# Patient Record
Sex: Female | Born: 1965 | Race: White | Hispanic: No | Marital: Married | Smoking: Former smoker
Health system: Southern US, Community
[De-identification: ages and names within clinical notes are randomized; demographics above are authoritative.]

## PROBLEM LIST (undated history)

## (undated) DIAGNOSIS — J45909 Unspecified asthma, uncomplicated: Secondary | ICD-10-CM

## (undated) DIAGNOSIS — K219 Gastro-esophageal reflux disease without esophagitis: Secondary | ICD-10-CM

## (undated) DIAGNOSIS — I10 Essential (primary) hypertension: Secondary | ICD-10-CM

## (undated) DIAGNOSIS — C801 Malignant (primary) neoplasm, unspecified: Secondary | ICD-10-CM

## (undated) HISTORY — DX: Gastro-esophageal reflux disease without esophagitis: K21.9

## (undated) HISTORY — PX: POLYPECTOMY: SHX149

## (undated) HISTORY — DX: Essential (primary) hypertension: I10

---
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
## 2001-01-04 HISTORY — PX: ADENOIDECTOMY: SHX5191

## 2008-01-06 HISTORY — PX: BASAL CELL CARCINOMA EXCISION: SHX1214

## 2008-06-01 ENCOUNTER — Ambulatory Visit: Payer: Self-pay | Admitting: Gastroenterology

## 2008-09-27 LAB — HM PAP SMEAR: HM Pap smear: NORMAL

## 2009-08-09 ENCOUNTER — Emergency Department: Payer: Self-pay | Admitting: Internal Medicine

## 2009-08-23 ENCOUNTER — Ambulatory Visit: Payer: Self-pay | Admitting: Gastroenterology

## 2011-05-20 ENCOUNTER — Ambulatory Visit: Payer: Self-pay | Admitting: Family Medicine

## 2014-09-11 LAB — BASIC METABOLIC PANEL
BUN: 14 mg/dL (ref 4–21)
CREATININE: 0.7 mg/dL (ref 0.5–1.1)
Glucose: 98 mg/dL
Potassium: 4.7 mmol/L (ref 3.4–5.3)
SODIUM: 140 mmol/L (ref 137–147)

## 2014-09-11 LAB — LIPID PANEL
CHOLESTEROL: 143 mg/dL (ref 0–200)
HDL: 40 mg/dL (ref 35–70)
LDL Cholesterol: 74 mg/dL
Triglycerides: 146 mg/dL (ref 40–160)

## 2014-09-11 LAB — CBC AND DIFFERENTIAL
HEMATOCRIT: 35 % — AB (ref 36–46)
HEMOGLOBIN: 11.8 g/dL — AB (ref 12.0–16.0)
Platelets: 289 10*3/uL (ref 150–399)
WBC: 8 10^3/mL

## 2014-11-07 ENCOUNTER — Ambulatory Visit: Payer: Self-pay | Admitting: Family Medicine

## 2015-03-08 DIAGNOSIS — L509 Urticaria, unspecified: Secondary | ICD-10-CM | POA: Insufficient documentation

## 2015-03-08 DIAGNOSIS — C4491 Basal cell carcinoma of skin, unspecified: Secondary | ICD-10-CM | POA: Insufficient documentation

## 2015-03-08 DIAGNOSIS — K219 Gastro-esophageal reflux disease without esophagitis: Secondary | ICD-10-CM | POA: Insufficient documentation

## 2015-03-08 DIAGNOSIS — L700 Acne vulgaris: Secondary | ICD-10-CM | POA: Insufficient documentation

## 2015-03-08 DIAGNOSIS — IMO0002 Reserved for concepts with insufficient information to code with codable children: Secondary | ICD-10-CM | POA: Insufficient documentation

## 2015-03-08 DIAGNOSIS — K2950 Unspecified chronic gastritis without bleeding: Secondary | ICD-10-CM | POA: Insufficient documentation

## 2015-03-08 DIAGNOSIS — IMO0001 Reserved for inherently not codable concepts without codable children: Secondary | ICD-10-CM | POA: Insufficient documentation

## 2015-03-08 DIAGNOSIS — E282 Polycystic ovarian syndrome: Secondary | ICD-10-CM | POA: Insufficient documentation

## 2015-03-08 DIAGNOSIS — M546 Pain in thoracic spine: Secondary | ICD-10-CM | POA: Insufficient documentation

## 2015-03-08 DIAGNOSIS — R7303 Prediabetes: Secondary | ICD-10-CM | POA: Insufficient documentation

## 2015-03-08 DIAGNOSIS — R51 Headache: Secondary | ICD-10-CM

## 2015-03-08 DIAGNOSIS — R030 Elevated blood-pressure reading, without diagnosis of hypertension: Secondary | ICD-10-CM

## 2015-03-08 DIAGNOSIS — R519 Headache, unspecified: Secondary | ICD-10-CM | POA: Insufficient documentation

## 2015-03-09 ENCOUNTER — Other Ambulatory Visit: Payer: Self-pay

## 2015-03-09 ENCOUNTER — Ambulatory Visit (INDEPENDENT_AMBULATORY_CARE_PROVIDER_SITE_OTHER): Payer: BLUE CROSS/BLUE SHIELD | Admitting: Physician Assistant

## 2015-03-09 ENCOUNTER — Ambulatory Visit
Admission: RE | Admit: 2015-03-09 | Discharge: 2015-03-09 | Disposition: A | Discharge status: Home / Self Care | Payer: BLUE CROSS/BLUE SHIELD | Source: Ambulatory Visit | Attending: Physician Assistant | Admitting: Physician Assistant

## 2015-03-09 ENCOUNTER — Encounter: Payer: Self-pay | Admitting: Physician Assistant

## 2015-03-09 VITALS — BP 126/70 | HR 64 | Resp 14 | Wt 241.6 lb

## 2015-03-09 DIAGNOSIS — R042 Hemoptysis: Secondary | ICD-10-CM | POA: Diagnosis present

## 2015-03-09 NOTE — Progress Notes (Signed)
   Subjective:    Patient ID: Heather Bauer, female    DOB: Mar 05, 1966, 49 y.o.   MRN: [id number]  Cough This is a recurrent problem. The current episode started 1 to 4 weeks ago. The problem has been gradually improving. The problem occurs every few minutes. The cough is productive of blood-tinged sputum. Associated symptoms include heartburn, hemoptysis, nasal congestion, postnasal drip, a sore throat and shortness of breath. Pertinent negatives include no chest pain, chills, ear congestion, ear pain, fever, headaches, myalgias, rash, rhinorrhea, sweats, weight loss or wheezing. The symptoms are aggravated by lying down. Risk factors: possible mold exposure in home. She has tried a beta-agonist inhaler, body position changes, OTC cough suppressant, prescription cough suppressant, steroid inhaler and rest for the symptoms. The treatment provided mild relief. Her past medical history is significant for bronchitis and environmental allergies. There is no history of asthma, bronchiectasis, COPD, emphysema or pneumonia.      Review of Systems  Constitutional: Negative for fever, chills and weight loss.  HENT: Positive for postnasal drip and sore throat. Negative for ear pain and rhinorrhea.   Respiratory: Positive for cough, hemoptysis and shortness of breath. Negative for wheezing.   Cardiovascular: Negative for chest pain.  Gastrointestinal: Positive for heartburn and vomiting.  Musculoskeletal: Negative for myalgias.  Skin: Negative for rash.  Allergic/Immunologic: Positive for environmental allergies.  Neurological: Negative for headaches.       Objective:   Physical Exam  Constitutional: She appears well-developed and well-nourished. No distress.  HENT:  Head: Normocephalic and atraumatic.  Right Ear: Tympanic membrane, external ear and ear canal normal.  Left Ear: Tympanic membrane, external ear and ear canal normal.  Nose: Nose normal.  Mouth/Throat: Oropharynx is clear and moist.  No oropharyngeal exudate.  Neck: Normal range of motion. Neck supple.  Cardiovascular: Normal rate, regular rhythm and normal heart sounds.  Exam reveals no gallop and no friction rub.   No murmur heard. Pulmonary/Chest: Effort normal and breath sounds normal. No respiratory distress. She has no wheezes. She has no rales. She exhibits no tenderness.  Lymphadenopathy:    She has no cervical adenopathy.  Skin: Skin is warm and dry.  Vitals reviewed.         Assessment & Plan:  1. Cough with hemoptysis Being that the blood tinged sputum is new and with smoking history will get CXR and refer to pulmonology for further evaluation.  If pulmonology exam is normal but still having blood-tinged sputum will consider referral to GI for further evaluation with EGD due to GERD.  She has had increased burning sensation of the throat since the bronchitis began and I advised her to begin her dexilant again, which she has and takes daily now.  She will call if symptoms worsen or fail to improve.  - DG Chest 2 View; Future - Ambulatory referral to Pulmonology

## 2015-03-09 NOTE — Patient Instructions (Signed)
Hemoptysis Hemoptysis, which means coughing up blood, can be a sign of a minor problem or a serious medical condition. The blood that is coughed up may come from the lungs and airways. Coughed-up blood can also come from bleeding that occurs outside the lungs and airways. Blood can drain into the windpipe during a severe nosebleed or when blood is vomited from the stomach. Because hemoptysis can be a sign of something serious, a medical evaluation is required. For some people with hemoptysis, no definite cause is ever identified. CAUSES  The most common cause of hemoptysis is bronchitis. Some other common causes include:   A ruptured blood vessel caused by coughing or an infection.   A medical condition that causes damage to the large air passageways (bronchiectasis).   A blood clot in the lungs (pulmonary embolism).   Pneumonia.   Tuberculosis.   Breathing in a small foreign object.   Cancer. For some people with hemoptysis, no definite cause is ever identified.  HOME CARE INSTRUCTIONS  Only take over-the-counter or prescription medicines as directed by your caregiver. Do not use cough suppressants unless your caregiver approves.  If your caregiver prescribes antibiotic medicines, take them as directed. Finish them even if you start to feel better.  Do not smoke. Also avoid secondhand smoke.  Follow up with your caregiver as directed. SEEK IMMEDIATE MEDICAL CARE IF:   You cough up bloody mucus for longer than a week.  You have a blood-producing cough that is severe or getting worse.  You have a blood-producing cough thatcomes and goes over time.  You develop problems with your breathing.   You vomit blood.  You develop bloody or black-colored stools.  You have chest pain.   You develop night sweats.  You feel faint or pass out.   You have a fever or persistent symptoms for more than 2-3 days.  You have a fever and your symptoms suddenly get worse. MAKE  SURE YOU:  Understand these instructions.  Will watch your condition.  Will get help right away if you are not doing well or get worse. Document Released: 12/01/2001 Document Revised: 09/08/2012 Document Reviewed: 07/09/2012 [facility] Patient Information 2015 [place], [place]. This information is not intended to replace advice given to you by your health care provider. Make sure you discuss any questions you have with your health care provider. Cough, Adult  A cough is a reflex that helps clear your throat and airways. It can help heal the body or may be a reaction to an irritated airway. A cough may only last 2 or 3 weeks (acute) or may last more than 8 weeks (chronic).  CAUSES Acute cough:  Viral or bacterial infections. Chronic cough:  Infections.  Allergies.  Asthma.  Post-nasal drip.  Smoking.  Heartburn or acid reflux.  Some medicines.  Chronic lung problems (COPD).  Cancer. SYMPTOMS   Cough.  Fever.  Chest pain.  Increased breathing rate.  High-pitched whistling sound when breathing (wheezing).  Colored mucus that you cough up (sputum). TREATMENT   A bacterial cough may be treated with antibiotic medicine.  A viral cough must run its course and will not respond to antibiotics.  Your caregiver may recommend other treatments if you have a chronic cough. HOME CARE INSTRUCTIONS   Only take over-the-counter or prescription medicines for pain, discomfort, or fever as directed by your caregiver. Use cough suppressants only as directed by your caregiver.  Use a cold steam vaporizer or humidifier in your bedroom or home to  help loosen secretions.  Sleep in a semi-upright position if your cough is worse at night.  Rest as needed.  Stop smoking if you smoke. SEEK IMMEDIATE MEDICAL CARE IF:   You have pus in your sputum.  Your cough starts to worsen.  You cannot control your cough with suppressants and are losing sleep.  You begin coughing up  blood.  You have difficulty breathing.  You develop pain which is getting worse or is uncontrolled with medicine.  You have a fever. MAKE SURE YOU:   Understand these instructions.  Will watch your condition.  Will get help right away if you are not doing well or get worse. Document Released: 03/21/2011 Document Revised: 12/15/2011 Document Reviewed: 03/21/2011 [facility] Patient Information 2015 [place], [place]. This information is not intended to replace advice given to you by your health care provider. Make sure you discuss any questions you have with your health care provider.

## 2015-03-27 ENCOUNTER — Telehealth: Payer: Self-pay | Admitting: *Deleted

## 2015-03-27 ENCOUNTER — Ambulatory Visit (INDEPENDENT_AMBULATORY_CARE_PROVIDER_SITE_OTHER): Payer: BLUE CROSS/BLUE SHIELD | Admitting: Internal Medicine

## 2015-03-27 ENCOUNTER — Encounter: Payer: Self-pay | Admitting: Internal Medicine

## 2015-03-27 DIAGNOSIS — R05 Cough: Secondary | ICD-10-CM | POA: Diagnosis not present

## 2015-03-27 DIAGNOSIS — R042 Hemoptysis: Secondary | ICD-10-CM | POA: Diagnosis not present

## 2015-03-27 DIAGNOSIS — R059 Cough, unspecified: Secondary | ICD-10-CM | POA: Insufficient documentation

## 2015-03-27 NOTE — Progress Notes (Signed)
Date: 04/05/2015  MRN# [id number] Heather Bauer 05-02-1966  Referring Physician:   NIKAYA NASBY is a 49 y.o. old female seen in consultation for hemoptysis  CC:  Chief Complaint  Patient presents with  . Advice Only    Referred by Lelon Huh MD: Pt coughing up blood bright red to pink end of May 2016. Pt had bronchitis, she now has cough and has clear to cloudy tint.. Pt was given abx, cough syrup and Dulera.    HPI:  Patient is a pleasant 49 year old female presenting today for further evaluation of suspected hemoptysis. Patient stated that earlier on this year she had a "bronchitis," and since then had a mild coughing up of blood. During that time she had 2-3 days of fever and fatigue along with productive yellow-green sputum the progressed to blood-tinged sputum. Patient stated blood started in her sputum about 5 days later continued for about 2 nights with a bad cough, blood was bright in appearance, quantified as a large amount. She was placed in antibiotics by her primary care physician, Levaquin 10 days. She was also placed on an inhaler by her primary care physician, Ruthe Mannan, but after 3 days develop reflux and nasal congestion increased urination and stopped Dulera. She denies any blood in stool. Patient currently states that she has a mild sore throat with mild productive cough of thick white sputum, no further bloody episodes since the end of May 2016.  Social history significant for smoking one pack a day for 7 years, quit at about the age of 51. Patient states that she has a large acreage of land that has 2 dogs, 8 horses, 1 pig, and 1 cow.  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   PMHX:   Past Medical History  Diagnosis Date  . HTN (hypertension)   . GERD (gastroesophageal reflux disease)    Surgical Hx:  Past Surgical History  Procedure Laterality Date  . Tonsillectomy  1980  . Adenoidectomy  01/2001  . Basal cell  carcinoma excision  01/06/2008  . Polypectomy      benign   Family Hx:  Family History  Problem Relation Age of Onset  . Hypertension Mother   . Uterine cancer Mother   . Graves' disease Mother   . Hyperlipidemia Father   . Hypertension Father   . COPD Father   . Fibrocystic breast disease Sister   . Colon cancer Maternal Aunt   . Diabetes Maternal Grandmother   . Heart attack Maternal Grandmother   . Hypertension Maternal Grandfather   . Heart disease Maternal Grandfather   . Heart attack Maternal Grandfather    Social Hx:   History  Substance Use Topics  . Smoking status: Former Smoker -- 0.50 packs/day for 7 years    Types: Cigarettes  . Smokeless tobacco: Never Used  . Alcohol Use: 0.0 oz/week    0 Standard drinks or equivalent per week     Comment: occasionally   Medication:   Current Outpatient Rx  Name  Route  Sig  Dispense  Refill  . Dexlansoprazole 30 MG capsule   Oral   Take 30 mg by mouth daily.         Marland Kitchen loratadine (CLARITIN) 10 MG tablet   Oral   Take 1 tablet by mouth daily.         . propranolol (INDERAL) 40 MG tablet   Oral   Take 1 tablet by mouth every 12 (twelve)  hours.             Allergies:  Methocarbamol; Sulfa antibiotics; and Tape  Review of Systems: Gen:  Denies  fever, sweats, chills HEENT: Denies blurred vision, double vision, ear pain, eye pain, hearing loss, nose bleeds, sore throat Cvc:  No dizziness, chest pain or heaviness Resp:   Denies cough or sputum porduction, shortness of breath Gi: Denies swallowing difficulty, stomach pain, nausea or vomiting, diarrhea, constipation, bowel incontinence Gu:  Denies bladder incontinence, burning urine Ext:   No Joint pain, stiffness or swelling Skin: No skin rash, easy bruising or bleeding or hives Endoc:  No polyuria, polydipsia , polyphagia or weight change Psych: No depression, insomnia or hallucinations  Other:  All other systems negative  Physical Examination:   VS:  LMP 02/23/2015  General Appearance: No distress  Neuro:without focal findings, mental status, speech normal, alert and oriented, cranial nerves 2-12 intact, reflexes normal and symmetric, sensation grossly normal  HEENT: PERRLA, EOM intact, no ptosis, no other lesions noticed; Mallampati 2 Pulmonary: normal breath sounds., diaphragmatic excursion normal.No wheezing, No rales;   Sputum Production:  none CardiovascularNormal S1,S2.  No m/r/g.  Abdominal aorta pulsation normal.    Abdomen: Benign, Soft, non-tender, No masses, hepatosplenomegaly, No lymphadenopathy Renal:  No costovertebral tenderness  GU:  No performed at this time. Endoc: No evident thyromegaly, no signs of acromegaly or Cushing features Skin:   warm, no rashes, no ecchymosis  Extremities: normal, no cyanosis, clubbing, no edema, warm with normal capillary refill. Other findings:none    Rad results: (The following images and results were reviewed by Dr. Stevenson Clinch). 03/09/2015 CHEST 2 VIEW  COMPARISON: May 20, 2011.  FINDINGS: The heart size and mediastinal contours are within normal limits. Both lungs are clear. No pneumothorax or pleural effusion is noted. The visualized skeletal structures are unremarkable.  IMPRESSION: No active cardiopulmonary disease.    Assessment and Plan: 49 year old female seen in consultation for suspected hemoptysis after upper respiratory tract infection Hemoptysis Differential diagnosis includes: Bronchitis, other upper respiratory tract infection, mucosal irritation/sloughing  Given the timeline of her symptoms, cough, fever, productive sputum, I believe that her suspected hemoptysis (blood-tinged sputum close) was most likely due to bronchitis.  She continues to have a cough, most likely postinfectious, however given her proximity to farm animals other infectious etiologies could be a potentials source; further evaluation with CT scan chest.  Currently patient is not having any  blood tinged sputum.  Plan: - CT chest with contrast prior to follow up for hemoptysis, cough - cont with diet, wt loss and exercise.  - avoid tobacco  Cough Patient's cough is most likely a postinfectious cough given her recent upper respiratory tract infection/bronchitis. Continue with supportive care.  Plan: -Supportive care    Updated Medication List Outpatient Encounter Prescriptions as of 03/27/2015  Medication Sig  . Dexlansoprazole 30 MG capsule Take 30 mg by mouth daily.  Marland Kitchen loratadine (CLARITIN) 10 MG tablet Take 1 tablet by mouth daily.  . propranolol (INDERAL) 40 MG tablet Take 1 tablet by mouth every 12 (twelve) hours.  . [DISCONTINUED] HYDROcodone-homatropine (HYCODAN) 5-1.5 MG/5ML syrup Take by mouth. 1 teaspoon every 6 hours as needed for cough   No facility-administered encounter medications on file as of 03/27/2015.    Orders for this visit: Orders Placed This Encounter  Procedures  . CT Chest W Contrast    Standing Status: Future     Number of Occurrences:      Standing Expiration Date: 05/26/2016  Order Specific Question:  Reason for Exam (SYMPTOM  OR DIAGNOSIS REQUIRED)    Answer:  cough    Order Specific Question:  Is the patient pregnant?    Answer:  No    Order Specific Question:  Preferred imaging location?    Answer:  [facility]     Thank  you for the consultation and for allowing [facility] to assist in the care of your patient. Our recommendations are noted above.  Please contact us if we can be of further service.   Vilinda Boehringer, MD [facility] Office Number: [phone]

## 2015-03-27 NOTE — Telephone Encounter (Signed)
Spoke with patient and her dexalant was added to the medication list.

## 2015-03-27 NOTE — Patient Instructions (Signed)
Follow up with Dr. Stevenson Clinch in 4 weeks - CT chest with contrast prior to follow up for hemoptysis, cough - cont with diet, wt loss and exercise.  - avoid tobacco

## 2015-03-27 NOTE — Telephone Encounter (Signed)
Please call patient as she has a question regarding her medication.

## 2015-04-05 ENCOUNTER — Encounter: Payer: Self-pay | Admitting: Internal Medicine

## 2015-04-05 NOTE — Assessment & Plan Note (Signed)
Differential diagnosis includes: Bronchitis, other upper respiratory tract infection, mucosal irritation/sloughing  Given the timeline of her symptoms, cough, fever, productive sputum, I believe that her suspected hemoptysis (blood-tinged sputum close) was most likely due to bronchitis.  She continues to have a cough, most likely postinfectious, however given her proximity to farm animals other infectious etiologies could be a potentials source; further evaluation with CT scan chest.  Currently patient is not having any blood tinged sputum.  Plan: - CT chest with contrast prior to follow up for hemoptysis, cough - cont with diet, wt loss and exercise.  - avoid tobacco

## 2015-04-05 NOTE — Assessment & Plan Note (Signed)
Patient's cough is most likely a postinfectious cough given her recent upper respiratory tract infection/bronchitis. Continue with supportive care.  Plan: -Supportive care

## 2015-04-17 ENCOUNTER — Ambulatory Visit
Admission: RE | Admit: 2015-04-17 | Discharge: 2015-04-17 | Disposition: A | Discharge status: Home / Self Care | Payer: BLUE CROSS/BLUE SHIELD | Source: Ambulatory Visit | Attending: Internal Medicine | Admitting: Internal Medicine

## 2015-04-17 ENCOUNTER — Telehealth: Payer: Self-pay | Admitting: *Deleted

## 2015-04-17 DIAGNOSIS — R918 Other nonspecific abnormal finding of lung field: Secondary | ICD-10-CM | POA: Diagnosis not present

## 2015-04-17 DIAGNOSIS — R05 Cough: Secondary | ICD-10-CM | POA: Diagnosis present

## 2015-04-17 DIAGNOSIS — R059 Cough, unspecified: Secondary | ICD-10-CM

## 2015-04-17 DIAGNOSIS — R042 Hemoptysis: Secondary | ICD-10-CM

## 2015-04-17 HISTORY — DX: Unspecified asthma, uncomplicated: J45.909

## 2015-04-17 HISTORY — DX: Malignant (primary) neoplasm, unspecified: C80.1

## 2015-04-17 MED ORDER — IOHEXOL 300 MG/ML  SOLN
75.0000 mL | Freq: Once | INTRAMUSCULAR | Status: AC | PRN
  Administered 2015-04-17: 75 mL via INTRAVENOUS

## 2015-04-17 NOTE — Telephone Encounter (Signed)
-----   Message from Vilinda Boehringer, MD sent at 04/17/2015  4:10 PM EDT ----- Please inform patient she has a normal CT, she has some small inflammatory nodules that have been there since 2010, they are the same size. Further details to be discussed at follow up visit.   Thank you.

## 2015-04-18 NOTE — Telephone Encounter (Signed)
LM for pt to call office for results of CT

## 2015-04-18 NOTE — Telephone Encounter (Signed)
Pt aware.

## 2015-04-24 ENCOUNTER — Ambulatory Visit (INDEPENDENT_AMBULATORY_CARE_PROVIDER_SITE_OTHER): Payer: BLUE CROSS/BLUE SHIELD | Admitting: Internal Medicine

## 2015-04-24 ENCOUNTER — Encounter: Payer: Self-pay | Admitting: Internal Medicine

## 2015-04-24 VITALS — BP 134/68 | HR 67 | Temp 98.7°F | Wt 241.0 lb

## 2015-04-24 DIAGNOSIS — R042 Hemoptysis: Secondary | ICD-10-CM | POA: Diagnosis not present

## 2015-04-24 DIAGNOSIS — J019 Acute sinusitis, unspecified: Secondary | ICD-10-CM

## 2015-04-24 DIAGNOSIS — J329 Chronic sinusitis, unspecified: Secondary | ICD-10-CM | POA: Insufficient documentation

## 2015-04-24 DIAGNOSIS — J029 Acute pharyngitis, unspecified: Secondary | ICD-10-CM | POA: Insufficient documentation

## 2015-04-24 MED ORDER — CLARITHROMYCIN 500 MG PO TABS: 500.0000 mg | ORAL_TABLET | Freq: Two times a day (BID) | ORAL | Status: DC

## 2015-04-24 NOTE — Assessment & Plan Note (Signed)
Secondary to previous bronchitis. Repeat CAT scan with no significant findings of tumor, mass, bronchiectasis. She does have calcified reactive nodules which have not increased in size when compared to her 2010 CT. No further workup needed at this time given benign findings on CT along with resolving clinical appearance.

## 2015-04-24 NOTE — Progress Notes (Signed)
MRN# [id number] Heather Bauer 05-23-1966   CC: Chief Complaint  Patient presents with  . Follow-up    no cough, sore throat over weekend;      Brief History: HPI: 03/2014 Patient is a pleasant 49 year old female presenting today for further evaluation of suspected hemoptysis. Patient stated that earlier on this year she had a "bronchitis," and since then had a mild coughing up of blood. During that time she had 2-3 days of fever and fatigue along with productive yellow-green sputum the progressed to blood-tinged sputum. Patient stated blood started in her sputum about 5 days later continued for about 2 nights with a bad cough, blood was bright in appearance, quantified as a large amount. She was placed in antibiotics by her primary care physician, Levaquin 10 days. She was also placed on an inhaler by her primary care physician, Ruthe Mannan, but after 3 days develop reflux and nasal congestion increased urination and stopped Dulera. She denies any blood in stool. Patient currently states that she has a mild sore throat with mild productive cough of thick white sputum, no further bloody episodes since the end of May 2016.  Social history significant for smoking one pack a day for 7 years, quit at about the age of 35. Patient states that she has a large acreage of land that has 2 dogs, 8 horses, 1 pig, and 1 cow. 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  Plan - CT Chest, avoid tobacco, diet and weight loss  Events since last clinic visit: Patient presents today for follow-up visit. At her last visit she was evaluated for hemoptysis. Since her last visit she's had no further episodes of hemoptysis. However, she has had increase nasal drainage, pharyngeal irritation and throat pain that started last Thursday. She said she had "swollen lymph nodes" in her throat, and sinus drainage. She had a repeat CT of chest done that she would like to discuss  today.     Medication:   Current Outpatient Rx  Name  Route  Sig  Dispense  Refill  . Dexlansoprazole 30 MG capsule   Oral   Take 30 mg by mouth daily.         Marland Kitchen loratadine (CLARITIN) 10 MG tablet   Oral   Take 1 tablet by mouth daily.         . propranolol (INDERAL) 40 MG tablet   Oral   Take 1 tablet by mouth every 12 (twelve) hours.            Review of Systems: Gen:  Denies  fever, sweats, chills HEENT: Denies blurred vision, double vision, ear pain, eye pain, hearing loss, nose bleeds, sore throat Cvc:  No dizziness, chest pain or heaviness Resp:   Admits to: Gi: Denies swallowing difficulty, stomach pain, nausea or vomiting, diarrhea, constipation, bowel incontinence Gu:  Denies bladder incontinence, burning urine Ext:   No Joint pain, stiffness or swelling Skin: No skin rash, easy bruising or bleeding or hives Endoc:  No polyuria, polydipsia , polyphagia or weight change Other:  All other systems negative  Allergies:  Methocarbamol; Sulfa antibiotics; and Tape  Physical Examination:  VS: BP 134/68 mmHg  Pulse 67  Temp(Src) 98.7 F (37.1 C) (Oral)  Wt 241 lb (109.317 kg)  SpO2 97%  LMP 04/02/2015 (Approximate)  General Appearance: No distress  HEENT: PERRLA, moderate pharyngeal erythema, no exudates noted.  Pulmonary:normal breath sounds., diaphragmatic excursion normal.No wheezing, No rales   Cardiovascular:  Normal S1,S2.  No m/r/g.     Abdomen:Exam: Benign, Soft, non-tender, No masses  Skin:   warm, no rashes, no ecchymosis  Extremities: normal, no cyanosis, clubbing, warm with normal capillary refill.      Rad results: (The following images and results were reviewed by Dr. Stevenson Clinch). 04/17/15 CT CHEST WITH CONTRAST  TECHNIQUE: Multidetector CT imaging of the chest was performed during intravenous contrast administration.  CONTRAST: 29mL OMNIPAQUE IOHEXOL 300 MG/ML SOLN  COMPARISON: 08/09/2009  FINDINGS: The lungs are well aerated  bilaterally. No focal infiltrate or sizable effusion is seen. A small 3 mm nodule is noted within the right middle lobe anteriorly. This is best seen on image number 30 of series 3. The smaller nodule is noted within the right lower lobe laterally on image number 39 of series 3. In the left lung, there is a 6 mm nodule best seen on image number 39 of series 3 in the medial aspect of the left lower lobe. A few smaller subpleural nodules are also noted.  The thoracic inlet is within normal limits. The thoracic aorta and pulmonary artery as visualized are unremarkable. No hilar or mediastinal adenopathy is seen. No endobronchial lesion is noted. The visualized upper abdomen is within normal limits.  IMPRESSION: Scattered bilateral pulmonary nodules. The largest of these on the right measures 3 mm on image number 30 of series 3. The largest on the left measures 6 mm within the lower lobe on image number 39 of series 3.  When compared with the prior CT examination from 2010 these nodules are stable in consistent with a benign etiology.  No other focal abnormality is noted.    Assessment and Plan: 49 year old female seen in follow-up visit for hemoptysis secondary to bronchitis with complete resolution, now with sinusitis/pharyngitis Hemoptysis Secondary to previous bronchitis. Repeat CAT scan with no significant findings of tumor, mass, bronchiectasis. She does have calcified reactive nodules which have not increased in size when compared to her 2010 CT. No further workup needed at this time given benign findings on CT along with resolving clinical appearance.  Acute pharyngitis Acute pharyngitis, could be related to postnasal drip/upper airway cough syndrome. Plan: -Biaxin 500 mg, 1 tab oral twice a day for 10 days  Sinusitis nasal Possible allergies versus isolated acute sinusitis infection. There is no frontal or maxillary tenderness however she does have continuous clear to  yellowish nasal drainage. Will treat as acute sinusitis.  Plan: -Biaxin 500 mg, 1 tab oral twice a day for 10 days.    Updated Medication List Outpatient Encounter Prescriptions as of 04/24/2015  Medication Sig  . Dexlansoprazole 30 MG capsule Take 30 mg by mouth daily.  Marland Kitchen loratadine (CLARITIN) 10 MG tablet Take 1 tablet by mouth daily.  . propranolol (INDERAL) 40 MG tablet Take 1 tablet by mouth every 12 (twelve) hours.  . clarithromycin (BIAXIN) 500 MG tablet Take 1 tablet (500 mg total) by mouth 2 (two) times daily.   No facility-administered encounter medications on file as of 04/24/2015.    Orders for this visit: No orders of the defined types were placed in this encounter.    Thank  you for the visitation and for allowing  [facility] to assist in the care of your patient. Our recommendations are noted above.  Please contact us if we can be of further service.  Vilinda Boehringer, MD [facility] Office Number: [phone]

## 2015-04-24 NOTE — Assessment & Plan Note (Signed)
Acute pharyngitis, could be related to postnasal drip/upper airway cough syndrome. Plan: -Biaxin 500 mg, 1 tab oral twice a day for 10 days

## 2015-04-24 NOTE — Patient Instructions (Signed)
Follow up as needed with Dr. Stevenson Clinch - avoid all forms of Tobacco - you have a Sinusitis\Pharyngitis - Biaxin 500mg , 1 tab PO BID x 10days - if you Sinusitis\Pharyngitis is not improving then visit your PCP for further evaluation

## 2015-04-24 NOTE — Assessment & Plan Note (Signed)
Possible allergies versus isolated acute sinusitis infection. There is no frontal or maxillary tenderness however she does have continuous clear to yellowish nasal drainage. Will treat as acute sinusitis.  Plan: -Biaxin 500 mg, 1 tab oral twice a day for 10 days.

## 2015-06-13 ENCOUNTER — Other Ambulatory Visit: Payer: Self-pay | Admitting: Family Medicine

## 2015-08-23 LAB — HM PAP SMEAR: HM Pap smear: NEGATIVE

## 2015-09-14 ENCOUNTER — Ambulatory Visit (INDEPENDENT_AMBULATORY_CARE_PROVIDER_SITE_OTHER): Payer: BLUE CROSS/BLUE SHIELD | Admitting: Family Medicine

## 2015-09-14 ENCOUNTER — Encounter: Payer: Self-pay | Admitting: Family Medicine

## 2015-09-14 VITALS — BP 128/78 | HR 75 | Temp 98.5°F | Resp 14 | Ht 67.0 in | Wt 249.8 lb

## 2015-09-14 DIAGNOSIS — R253 Fasciculation: Secondary | ICD-10-CM | POA: Insufficient documentation

## 2015-09-14 DIAGNOSIS — R7303 Prediabetes: Secondary | ICD-10-CM

## 2015-09-14 DIAGNOSIS — E668 Other obesity: Secondary | ICD-10-CM

## 2015-09-14 DIAGNOSIS — E611 Iron deficiency: Secondary | ICD-10-CM | POA: Diagnosis not present

## 2015-09-14 DIAGNOSIS — Z0000 Encounter for general adult medical examination without abnormal findings: Secondary | ICD-10-CM | POA: Diagnosis not present

## 2015-09-14 DIAGNOSIS — Z23 Encounter for immunization: Secondary | ICD-10-CM | POA: Diagnosis not present

## 2015-09-14 DIAGNOSIS — IMO0002 Reserved for concepts with insufficient information to code with codable children: Secondary | ICD-10-CM

## 2015-09-14 DIAGNOSIS — R102 Pelvic and perineal pain: Secondary | ICD-10-CM | POA: Insufficient documentation

## 2015-09-14 NOTE — Progress Notes (Signed)
Patient ID: [name], female   DOB: 05-21-1966, 49 y.o.   MRN: [id number]       Patient: Heather Bauer, Female    DOB: 1966/04/01, 49 y.o.   MRN: [id number] Visit Date: 09/14/2015  Today's Provider: Lelon Huh, MD   Chief Complaint  Patient presents with  . Annual Exam  . Gastroesophageal Reflux    follow up  . Hyperglycemia    follow up  . Blood Pressure Check    follow up of eleveted blood pressure  . Anemia    follow up   Subjective:    Annual physical exam Heather Bauer is a 49 y.o. female who presents today for health maintenance and complete physical. She feels well. She reports exercising one time a week. She reports she is sleeping well (average 7 hours per night).  She recently had yearly gyn exam and had lipids checked which were normal.   ----------------------------------------------------------------- Shakes: She states she starting having shakes in her abdominal muscles several months ago. She had gyn evaluation and states she had normal ultrasound. Episodes are not at all painful, but sometimes wake her at night. Has occasional felt it in arms and sides.   Follow up GERD: Last office visit was 7 months ago. Patient was seen by Fenton Malling PA. Management  during that visit includes advising patient to restart Dexilant.  Follow up Prediabetes:  Last Office visit was 1 year ago and no changes were made.  Follow up elevated blood pressure:  Last office visit was 2 years ago and patient was started on Propanolol.   Follow up Iron deficiency:  Is not taking any nutritional supplements at this time.   Review of Systems  Constitutional: Negative.   HENT: Negative.   Eyes: Negative.   Respiratory: Negative.   Cardiovascular: Negative.   Gastrointestinal: Positive for abdominal pain.  Endocrine: Negative.   Genitourinary: Negative.   Musculoskeletal: Negative.   Skin: Negative.   Allergic/Immunologic: Negative.   Neurological: Negative.     Hematological: Negative.   Psychiatric/Behavioral: Negative.     Social History      She  reports that she has quit smoking. Her smoking use included Cigarettes. She has a 3.5 pack-year smoking history. She has never used smokeless tobacco. She reports that she drinks alcohol. She reports that she does not use illicit drugs.       Social History   Social History  . Marital Status: Single    Spouse Name: N/A  . Number of Children: N/A  . Years of Education: N/A   Social History Main Topics  . Smoking status: Former Smoker -- 0.50 packs/day for 7 years    Types: Cigarettes  . Smokeless tobacco: Never Used  . Alcohol Use: 0.0 oz/week    0 Standard drinks or equivalent per week     Comment: occasionally  . Drug Use: No  . Sexual Activity: Not Asked   Other Topics Concern  . None   Social History Narrative    Patient Active Problem List   Diagnosis Date Noted  . Adnexal pain 09/14/2015  . Acute pharyngitis 04/24/2015  . Sinusitis nasal 04/24/2015  . Hemoptysis 03/27/2015  . Cough 03/27/2015  . Acne vulgaris 03/08/2015  . Basal cell carcinoma of skin 03/08/2015  . Back pain, thoracic 03/08/2015  . Chronic gastritis 03/08/2015  . Blood pressure elevated 03/08/2015  . Esophageal reflux 03/08/2015  . Cephalalgia 03/08/2015  . Urticaria 03/08/2015  . Adult BMI 30+ 03/08/2015  .  Polycystic ovaries 03/08/2015  . Borderline diabetes 03/08/2015    Past Surgical History  Procedure Laterality Date  . Tonsillectomy  1980  . Adenoidectomy  01/2001  . Basal cell carcinoma excision  01/06/2008  . Polypectomy      benign    Family History        Family Status  Relation Status Death Age  . Mother Alive   . Father Alive   . Sister Alive   . Maternal Aunt Alive   . Maternal Grandmother Alive   . Maternal Grandfather Alive         Her family history includes COPD in her father; Colon cancer in her maternal aunt; Diabetes in her maternal grandmother; Fibrocystic breast  disease in her sister; Berenice Primas' disease in her mother; Heart attack in her maternal grandfather and maternal grandmother; Heart disease in her maternal grandfather; Hyperlipidemia in her father; Hypertension in her father, maternal grandfather, and mother; Uterine cancer in her mother.    Allergies  Allergen Reactions  . Methocarbamol Hives  . Sulfa Antibiotics     Fever  . Tape Other (See Comments)    Latex tape    Previous Medications   LORATADINE (CLARITIN) 10 MG TABLET    Take 1 tablet by mouth daily.   PROPRANOLOL (INDERAL) 40 MG TABLET    TAKE 1 TABLET BY MOUTH EVERY 12 HOURS    Patient Care Team: Mar Daring, PA-C as PCP - General (Physician Assistant)     Objective:   Vitals: BP 128/78 mmHg  Pulse 75  Temp(Src) 98.5 F (36.9 C) (Oral)  Resp 14  Ht 5\' 7"  (1.702 m)  Wt 249 lb 12.8 oz (113.309 kg)  BMI 39.12 kg/m2  SpO2 97%  LMP 08/25/2015   Physical Exam   General Appearance:    Alert, cooperative, no distress, appears stated age, obese  Head:    Normocephalic, without obvious abnormality, atraumatic  Eyes:    PERRL, conjunctiva/corneas clear, EOM's intact, fundi    benign, both eyes  Ears:    Normal TM's and external ear canals, both ears  Nose:   Nares normal, septum midline, mucosa normal, no drainage    or sinus tenderness  Throat:   Lips, mucosa, and tongue normal; teeth and gums normal  Neck:   Supple, symmetrical, trachea midline, no adenopathy;    thyroid:  no enlargement/tenderness/nodules; no carotid   bruit or JVD  Back:     Symmetric, no curvature, ROM normal, no CVA tenderness  Lungs:     Clear to auscultation bilaterally, respirations unlabored  Chest Wall:    No tenderness or deformity   Heart:    Regular rate and rhythm, S1 and S2 normal, no murmur, rub   or gallop  Breast Exam:    deferred  Abdomen:     Soft, non-tender, bowel sounds active all four quadrants,    no masses, no organomegaly  Pelvic:    deferred  Extremities:    Extremities normal, atraumatic, no cyanosis or edema  Pulses:   2+ and symmetric all extremities  Skin:   Skin color, texture, turgor normal, no rashes or lesions  Lymph nodes:   Cervical, supraclavicular, and axillary nodes normal  Neurologic:   CNII-XII intact, normal strength, sensation and reflexes    throughout    Depression Screen No flowsheet data found.    Assessment & Plan:     Routine Health Maintenance and Physical Exam  Exercise Activities and Dietary recommendations Goals  None      Immunization History  Administered Date(s) Administered  . Influenza-Unspecified 08/17/2014  . Td 08/21/2004  . Tdap 09/12/2011        Discussed health benefits of physical activity, and encouraged her to engage in regular exercise appropriate for her age and condition.    --------------------------------------------------------------------  1. Annual physical exam Lipids already checked at gyn - CBC - Comprehensive metabolic panel - TSH  2. Need for influenza vaccination  - Flu Vaccine QUAD 36+ mos PF IM (Fluarix & Fluzone Quad PF)  3. Borderline diabetes  - Hemoglobin A1c  4. Iron deficiency  - Ferritin  5. Fasciculations  - Magnesium  6. Adult BMI 30+ Discussed diet and exercise.

## 2015-09-15 LAB — COMPREHENSIVE METABOLIC PANEL
A/G RATIO: 1.8 (ref 1.1–2.5)
ALBUMIN: 4.2 g/dL (ref 3.5–5.5)
ALT: 14 IU/L (ref 0–32)
AST: 13 IU/L (ref 0–40)
Alkaline Phosphatase: 63 IU/L (ref 39–117)
BUN/Creatinine Ratio: 15 (ref 9–23)
BUN: 12 mg/dL (ref 6–24)
Bilirubin Total: <0.2 mg/dL (ref 0.0–1.2)
CALCIUM: 9.2 mg/dL (ref 8.7–10.2)
CO2: 26 mmol/L (ref 18–29)
CREATININE: 0.79 mg/dL (ref 0.57–1.00)
Chloride: 101 mmol/L (ref 97–106)
GFR calc non Af Amer: 90 mL/min/{1.73_m2} (ref >59)
GFR, EST AFRICAN AMERICAN: 104 mL/min/{1.73_m2} (ref >59)
Globulin, Total: 2.3 g/dL (ref 1.5–4.5)
Glucose: 95 mg/dL (ref 65–99)
Potassium: 4.8 mmol/L (ref 3.5–5.2)
SODIUM: 139 mmol/L (ref 136–144)
Total Protein: 6.5 g/dL (ref 6.0–8.5)

## 2015-09-15 LAB — CBC
HEMATOCRIT: 33.2 % — AB (ref 34.0–46.6)
HEMOGLOBIN: 11.1 g/dL (ref 11.1–15.9)
MCH: 27.6 pg (ref 26.6–33.0)
MCHC: 33.4 g/dL (ref 31.5–35.7)
MCV: 83 fL (ref 79–97)
Platelets: 276 10*3/uL (ref 150–379)
RBC: 4.02 x10E6/uL (ref 3.77–5.28)
RDW: 14.7 % (ref 12.3–15.4)
WBC: 9.7 10*3/uL (ref 3.4–10.8)

## 2015-09-15 LAB — FERRITIN: FERRITIN: 30 ng/mL (ref 15–150)

## 2015-09-15 LAB — TSH: TSH: 1.53 u[IU]/mL (ref 0.450–4.500)

## 2015-09-15 LAB — HEMOGLOBIN A1C
Est. average glucose Bld gHb Est-mCnc: 131 mg/dL
Hgb A1c MFr Bld: 6.2 % — ABNORMAL HIGH (ref 4.8–5.6)

## 2015-09-15 LAB — MAGNESIUM: MAGNESIUM: 2 mg/dL (ref 1.6–2.3)

## 2016-01-21 ENCOUNTER — Other Ambulatory Visit: Payer: Self-pay | Admitting: Family Medicine

## 2016-01-21 ENCOUNTER — Ambulatory Visit: Payer: Self-pay | Admitting: Family Medicine

## 2016-01-23 ENCOUNTER — Ambulatory Visit: Payer: Self-pay | Admitting: Family Medicine

## 2016-02-21 ENCOUNTER — Ambulatory Visit: Payer: Self-pay | Admitting: Family Medicine

## 2016-03-06 ENCOUNTER — Encounter: Payer: Self-pay | Admitting: Family Medicine

## 2016-03-06 ENCOUNTER — Ambulatory Visit (INDEPENDENT_AMBULATORY_CARE_PROVIDER_SITE_OTHER): Payer: BLUE CROSS/BLUE SHIELD | Admitting: Family Medicine

## 2016-03-06 VITALS — BP 120/80 | HR 64 | Temp 98.1°F | Resp 16 | Ht 67.0 in | Wt 248.0 lb

## 2016-03-06 DIAGNOSIS — E611 Iron deficiency: Secondary | ICD-10-CM | POA: Diagnosis not present

## 2016-03-06 DIAGNOSIS — R140 Abdominal distension (gaseous): Secondary | ICD-10-CM | POA: Diagnosis not present

## 2016-03-06 DIAGNOSIS — R253 Fasciculation: Secondary | ICD-10-CM

## 2016-03-06 DIAGNOSIS — R7303 Prediabetes: Secondary | ICD-10-CM

## 2016-03-06 LAB — POCT GLYCOSYLATED HEMOGLOBIN (HGB A1C)
Est. average glucose Bld gHb Est-mCnc: 126
Hemoglobin A1C: 6

## 2016-03-06 NOTE — Patient Instructions (Addendum)
    Low-Gluten Eating Plan Gluten is a protein that is found in wheat, barley, rye, and some other grains. Some people have a condition that makes them unable to digest gluten. For those people, eating just a small amount of gluten can damage their intestines. This is not a gluten-free eating plan. This low-gluten eating plan is for people who feel better when they eat less gluten. WHAT DO I NEED TO KNOW ABOUT THIS EATING PLAN?  You can eat anything that does not contain wheat or other grains that have gluten.  You can eat anything that is labeled "gluten-free."  Make sure to read food labels.  Eat a variety of foods so you get all of the nutrients that you need.  Avoid processed foods and sauces because many of them contain wheat. To have more control over the ingredients in your meals, consider making food yourself instead of buying prepared foods. WHAT FOODS CAN I EAT? Grains Rice. Bulgur. Quinoa. Corn. Buckwheat. Amaranth. Corn tortillas or taco shells. Oatmeal that is labeled as "gluten free" or "uncontaminated." Vegetables Lettuce. Spinach. Peas. Beets. Cauliflower. Cabbage. Broccoli. Carrots. Tomatoes. Squash. Eggplant. Herbs. Peppers. Onions. Cucumbers. Brussels sprouts. Yams and sweet potatoes. Beans. Lentils. Fruits Bananas. Apples. Oranges. Grapes. Papaya. Mango. Pomegranate. Kiwi. Grapefruit. Cherries. Meats and Other Protein Sources Beef. Pork. Chicken. [place]. Fish. Eggs. Tofu. Beans. Nuts. Lentils. Dairy Milk. Ice cream. Yogurt. Cheese. Cottage cheese. Beverages Water. Coffee. Tea. Juice. Soda. Seltzer water. Condiments Mustard. Relish. Low-fat, low-sugar ketchup. Low-fat, low-sugar barbecue sauce. Vinegar. Low-fat or fat-free mayonnaise. Sweets and Desserts Honey. Sugar. Maple syrup. Fats and Oils Butter. Vegetable oil. Olive oil. Canola oil. [place] oil. Other Arrowroot or cornstarch. Potato flour. The items listed above may not be a complete list of recommended  foods or beverages. Contact your dietitian for more options. WHAT FOODS ARE NOT RECOMMENDED? Grains Wheat. Barley. Rye. Oatmeal. Meat and Other Protein Sources Seitan. Cold cuts. Hotdogs. Salami. Sausages. Beverages Beer. Condiments Malt vinegar. Salad dressing. Soy sauce. Sweets and Desserts Licorice. Brown rice syrup. Pre-made pudding or pudding mixes. Other Bouillon cubes. Canned or boxed pre-made soups or soup packets. Bagged chips, such as potato chips and tortilla chips. Seasoning packets. The items listed above may not be a complete list of foods and beverages to avoid. Contact your dietitian for more information.   This information is not intended to replace advice given to you by your health care provider. Make sure you discuss any questions you have with your health care provider.   Document Released: 02/06/2015 Document Reviewed: 02/06/2015 Elsevier Interactive Patient Education [employer].

## 2016-03-06 NOTE — Progress Notes (Signed)
/9/201      Patient: Heather Bauer Female    DOB: 1966/03/31   50 y.o.   MRN: [id number] Visit Date: 03/06/2016  Today's Provider: Lelon Huh, MD   Chief Complaint  Patient presents with  . Follow-up  . Diabetes   Subjective:    HPI    Follow-up for prediabetes from 09/14/2015; A1c was 6.2. Was advised to avoid all sugar and reduce starches in diet. Exercise 150 minutes per week. Has cut back on bread. Eating more fruit.  Has frequent bloating and diarrhea a few days per week.    Wt Readings from Last 3 Encounters:  03/06/16 248 lb (112.492 kg)  09/14/15 249 lb 12.8 oz (113.309 kg)  04/24/15 241 lb (109.317 kg)   Still has episodes of shakes in abdomen which are now causing restless sensation in legs which sometimes keeps her up at night. She had negative pelvic ultrasound by her gyn last year.   Follow-up for iron deficiency from 09/14/2015. She does not tolerate iron supplements well, but does take multivitamin every day.  Lab Results  Component Value Date   FERRITIN 30 09/14/2015     Allergies  Allergen Reactions  . Methocarbamol Hives  . Sulfa Antibiotics     Fever  . Tape Other (See Comments)    Latex tape   Previous Medications   LORATADINE (CLARITIN) 10 MG TABLET    Take 1 tablet by mouth daily.   PROPRANOLOL (INDERAL) 40 MG TABLET    TAKE 1 TABLET BY MOUTH EVERY 12 HOURS    Review of Systems  Constitutional: Negative for fever, chills, appetite change and fatigue.  Respiratory: Negative for chest tightness and shortness of breath.   Cardiovascular: Negative for chest pain and palpitations.  Gastrointestinal: Negative for nausea, vomiting and abdominal pain.  Neurological: Negative for dizziness and weakness.    Social History  Substance Use Topics  . Smoking status: Former Smoker -- 0.50 packs/day for 7 years    Types: Cigarettes  . Smokeless tobacco: Never Used  . Alcohol Use: 0.0 oz/week    0 Standard drinks or equivalent per week     Comment:  occasionally   Objective:   BP 120/80 mmHg  Pulse 64  Temp(Src) 98.1 F (36.7 C) (Oral)  Resp 16  Ht 5\' 7"  (1.702 m)  Wt 248 lb (112.492 kg)  BMI 38.83 kg/m2  SpO2 98%  LMP 02/21/2016  Physical Exam  General appearance: alert, well developed, well nourished, cooperative and in no distress Head: Normocephalic, without obvious abnormality, atraumatic Lungs: Respirations even and unlabored Extremities: No gross deformities Skin: Skin color, texture, turgor normal. No rashes seen  Psych: Appropriate mood and affect. Neurologic: Mental status: Alert, oriented to person, place, and time, thought content appropriate.   Results for orders placed or performed in visit on 03/06/16  POCT glycosylated hemoglobin (Hb A1C)  Result Value Ref Range   Hemoglobin A1C 6.0    Est. average glucose Bld gHb Est-mCnc 126         Assessment & Plan:     1. Borderline diabetes Improved. Avoid sweets and starches. Check a1c in 6 months.  - POCT glycosylated hemoglobin (Hb A1C)  2. Fasciculations Possibly related to iron deficiency as below  3. Abdominal bloating associated with diarrhea She had normal pelvic ultrasound last year. Try gluten free diet for a few weeks. If not effective she is to call for GI referral.    4. Iron deficiency Does not tolerate oral iron  replacement. Try increasing daily MVI to 2 a day.       Lelon Huh, MD  [facility]

## 2016-06-11 ENCOUNTER — Encounter: Payer: Self-pay | Admitting: Family Medicine

## 2016-06-11 ENCOUNTER — Ambulatory Visit (INDEPENDENT_AMBULATORY_CARE_PROVIDER_SITE_OTHER): Payer: BLUE CROSS/BLUE SHIELD | Admitting: Family Medicine

## 2016-06-11 VITALS — BP 130/88 | HR 64 | Temp 98.1°F | Resp 16 | Ht 67.0 in | Wt 253.0 lb

## 2016-06-11 DIAGNOSIS — E611 Iron deficiency: Secondary | ICD-10-CM | POA: Diagnosis not present

## 2016-06-11 DIAGNOSIS — R253 Fasciculation: Secondary | ICD-10-CM | POA: Diagnosis not present

## 2016-06-11 DIAGNOSIS — G2581 Restless legs syndrome: Secondary | ICD-10-CM | POA: Diagnosis not present

## 2016-06-11 NOTE — Progress Notes (Signed)
Patient: Heather Bauer Female    DOB: [birth year]   50 y.o.   MRN: [id number] Visit Date: 06/11/2016  Today's Provider: Lelon Huh, MD   Chief Complaint  Patient presents with  . Follow-up    abdominal spasms   Subjective:      Follow up for abdominal fasculations  The patient was last seen for this 3 months ago. She initially complained of these sensation at her CPE in December 2016, at which time she had been experiencing it for a few year.. Labs at that time found normal CBC, TSH, CMP, and magnesium. HgbA1c was 6.2. She has history of anemia and ferritin in December was 30. She tried taking iron supplements which she didn't tolerate. When she followed up in June sensations had worsened and had also developed shaky feels in her legs keeping her awake at night. She was also having some abdominal bloating. She has seen her gyn about this and had normal pelvic ultrasound by her report.  We had her try taking daily vitamins and gluten free diet which didn't help. She she states she will get random stinging pains in her legs and surface of her chest and abdomen throughout and the day, but especially at night. She states it feels lies a vibrating sensation throughout her legs and torso. She denies any increased stress or anxiety. No depression. No back or spine pain.    HPI   ------------------------------------------------------------------------------------       Allergies  Allergen Reactions  . Methocarbamol Hives  . Sulfa Antibiotics     Fever  . Tape Other (See Comments)    Latex tape     Current Outpatient Prescriptions:  .  loratadine (CLARITIN) 10 MG tablet, Take 1 tablet by mouth daily., Disp: , Rfl:  .  propranolol (INDERAL) 40 MG tablet, TAKE 1 TABLET BY MOUTH EVERY 12 HOURS, Disp: 60 tablet, Rfl: 4  Review of Systems  Constitutional: Negative.   Cardiovascular: Negative.   Gastrointestinal: Positive for abdominal pain.    Social History  Substance  Use Topics  . Smoking status: Former Smoker    Packs/day: 0.50    Years: 7.00    Types: Cigarettes  . Smokeless tobacco: Never Used  . Alcohol use 0.0 oz/week     Comment: occasionally   Objective:   BP 130/88 (BP Location: Right Arm, Patient Position: Sitting, Cuff Size: Large)   Pulse 64   Temp 98.1 F (36.7 C) (Oral)   Resp 16   Ht 5\' 7"  (1.702 m)   Wt 253 lb (114.8 kg)   LMP 05/28/2016 (Approximate)   BMI 39.63 kg/m   Physical Exam   General Appearance:    Alert, cooperative, no distress  Eyes:    PERRL, conjunctiva/corneas clear, EOM's intact       Lungs:     Clear to auscultation bilaterally, respirations unlabored  Heart:    Regular rate and rhythm  Neurologic:   Awake, alert, oriented x 3. No apparent focal neurological           defect.          Assessment & Plan:     1. Fasciculations Persistent, worsening, and now affecting entire torso and both legs. Warrants evaluation to rule out sensor-neural disorder. Discussed trial of clonazepam as she does have some RLS features. Will have neurology evaluate first.   - Ambulatory referral to Neurology         Lelon Huh, [place]  [facility]

## 2016-06-20 DIAGNOSIS — M62838 Other muscle spasm: Secondary | ICD-10-CM | POA: Insufficient documentation

## 2016-06-30 ENCOUNTER — Ambulatory Visit (INDEPENDENT_AMBULATORY_CARE_PROVIDER_SITE_OTHER): Payer: BLUE CROSS/BLUE SHIELD | Admitting: Physician Assistant

## 2016-06-30 ENCOUNTER — Encounter: Payer: Self-pay | Admitting: Physician Assistant

## 2016-06-30 ENCOUNTER — Telehealth: Payer: Self-pay

## 2016-06-30 VITALS — BP 128/72 | HR 76 | Temp 98.3°F | Resp 16 | Wt 254.0 lb

## 2016-06-30 DIAGNOSIS — R0789 Other chest pain: Secondary | ICD-10-CM | POA: Diagnosis not present

## 2016-06-30 MED ORDER — DEXLANSOPRAZOLE 60 MG PO CPDR: 60.0000 mg | DELAYED_RELEASE_CAPSULE | Freq: Every day | ORAL | 0 refills | Status: DC

## 2016-06-30 NOTE — Progress Notes (Signed)
Patient: Heather Bauer Female    DOB: 10-19-1965   50 y.o.   MRN: [id number] Visit Date: 06/30/2016  Today's Provider: Trinna Post, PA-C   Chief Complaint  Patient presents with  . Chest Pain    Started several days ago.    Subjective:    Chest Pain   This is a new problem. The current episode started in the past 7 days. The quality of the pain is described as tightness. The pain does not radiate. Associated symptoms include back pain (Chronic issue) and shortness of breath. Pertinent negatives include no abdominal pain, cough, diaphoresis, dizziness, fever, headaches, palpitations or weakness.  Pertinent negatives for past medical history include no seizures.   The patient is 50 year old female with a PMH significant for obesity, stable pulmonary nodules, and esophageal reflux who reports 6 days of "chest tightness." She states that she was performing her everyday activities when she noticed it started and described it as constant. She states that nothing makes it better including change of position or Tums. She locates the pain as being between her clavicles and her fourth ribs and states it travels into her throat when she swallows. She reports having heartburn this weekend but states this feels different. She reported taking Dexilant in 2010 which was discontinued in 2013. She stated that nothing makes it worse including exercise, positional changes, or breathing. She denies any radiation of discomfort to either arms, jaws, or back. She denies any nausea, vomiting, or diaphoresis. She denies any history of heart disease. She denies ever having this feeling. She denies any risk factors for pulmonary embolism including active cancer, oral contraceptives, long distance traveling, recent hospitalization or surgery, and calf pain or swelling. She denies any musculoskeletal pain or strain. She denies any remote or recent history of panic attacks.      Allergies  Allergen Reactions    . Methocarbamol Hives  . Sulfa Antibiotics     Fever  . Tape Other (See Comments)    Latex tape     Current Outpatient Prescriptions:  .  loratadine (CLARITIN) 10 MG tablet, Take 1 tablet by mouth daily., Disp: , Rfl:  .  propranolol (INDERAL) 40 MG tablet, TAKE 1 TABLET BY MOUTH EVERY 12 HOURS, Disp: 60 tablet, Rfl: 4  Review of Systems  Constitutional: Positive for fatigue (Chronic issue). Negative for activity change, appetite change, chills, diaphoresis, fever and unexpected weight change.  Respiratory: Positive for chest tightness, shortness of breath and wheezing. Negative for apnea, cough, choking and stridor.        Denies hemoptysis.   Cardiovascular: Positive for chest pain. Negative for palpitations and leg swelling.  Gastrointestinal: Negative.  Negative for abdominal pain.  Musculoskeletal: Positive for back pain (Chronic issue).  Neurological: Negative for dizziness, tremors, seizures, syncope, weakness, light-headedness and headaches.  Psychiatric/Behavioral: The patient is not nervous/anxious.   All other systems reviewed and are negative.   Social History  Substance Use Topics  . Smoking status: Former Smoker    Packs/day: 0.50    Years: 7.00    Types: Cigarettes  . Smokeless tobacco: Never Used  . Alcohol use 0.0 oz/week     Comment: occasionally   Objective:   BP 128/72 (BP Location: Left Arm, Patient Position: Sitting, Cuff Size: Large)   Pulse 76   Temp 98.3 F (36.8 C) (Oral)   Resp 16   Wt 254 lb (115.2 kg)   LMP 06/23/2016   SpO2  97%   BMI 39.78 kg/m   Physical Exam  Constitutional: She is oriented to person, place, and time. She appears well-developed and well-nourished. No distress.  Cardiovascular: Normal rate, regular rhythm, normal heart sounds and intact distal pulses.  Exam reveals no gallop and no friction rub.   No murmur heard. Pulmonary/Chest: Effort normal and breath sounds normal. No respiratory distress. She has no wheezes. She  has no rales. She exhibits no tenderness.  Abdominal: Soft. Bowel sounds are normal. She exhibits no distension. There is no tenderness. There is no rebound and no guarding.  Musculoskeletal: She exhibits no edema, tenderness or deformity.  No bilateral lower extremity edema. No chest wall deformity or rash.  Neurological: She is alert and oriented to person, place, and time.  Skin: Skin is warm and dry. No rash noted. She is not diaphoretic. No erythema.  Psychiatric: Her mood appears anxious.   I have reviewed patient's previous records to include her pulmonology visit for hemoptysis on 04/24/2015. Her hemoptysis had resolved, and a CT chest from 04/17/15 compared to a 2010 CT chest revealed stable pulmonary nodules of benign etiology.   A general surgery note from 06/28/2008 revealed a history of basal cell carcinoma removed by Mohs surgery with no additional suggestion of persistent basal cell carcinoma.     Assessment & Plan:      Results for orders placed or performed in visit on 03/06/16  POCT glycosylated hemoglobin (Hb A1C)  Result Value Ref Range   Hemoglobin A1C 6.0    Est. average glucose Bld gHb Est-mCnc 126     1. Chest tightness  Discussed with patient various etiologies of "chest tightness" to include cardiac, pulmonary, Gi, musculoskeletal, and anxiety. Have counseled patient that her in office EKG today appears normal and her constellation of symptoms do not suggest myocardial infarction. Have reviewed risk factors for pulmonary embolism with patient, of which she has none. Patient denies any musculoskeletal pain as well as panic attacks or increased anxiety. In light of this and her history of reflux, I think it is reasonable to begin a PPI trial to see if this helps her symptoms. She has agreed to try this.   - EKG 12-Lead: I have reviewed the EKG which revealed normal sinus rhythm, no ST elevations or changes. It is comparable to last EKG in 2015. -Dexilant 60 mg tablet  once daily 30 minutes before breakfast. Two sample boxes provided from office. Follow up in one week with Dr. Caryn Section to assess effectiveness.   She is to follow up with Dr. Caryn Section in one week. She is to proceed to the emergency department if she has intractable chest pain, nausea, vomiting, shortness of breath,.      Trinna Post, PA-C  [facility]

## 2016-06-30 NOTE — Patient Instructions (Signed)
Esophagitis °Esophagitis is inflammation of the esophagus. The esophagus is the tube that carries food and liquids from your mouth to your stomach. Esophagitis can cause soreness or pain in the esophagus. This condition can make it difficult and painful to swallow.  °CAUSES °Most causes of esophagitis are not serious. Common causes of this condition include: °· Gastroesophageal reflux disease (GERD). This is when stomach contents move back up into the esophagus (reflux). °· Repeated vomiting. °· An allergic-type reaction, especially caused by food allergies (eosinophilic esophagitis). °· Injury to the esophagus by swallowing large pills with or without water, or swallowing certain types of medicines. °· Swallowing (ingesting) harmful chemicals, such as household cleaning products. °· Heavy alcohol use. °· An infection of the esophagus. This most often occurs in people who have a weakened immune system. °· Radiation or chemotherapy treatment for cancer. °· Certain diseases such as sarcoidosis, Crohn disease, and scleroderma. °SYMPTOMS °Symptoms of this condition include: °· Difficult or painful swallowing. °· Pain with swallowing acidic liquids, such as citrus juices. °· Pain with burping. °· Chest pain. °· Difficulty breathing. °· Nausea. °· Vomiting. °· Pain in the abdomen. °· Weight loss. °· Ulcers in the mouth. °· Patches of white material in the mouth (candidiasis). °· Fever. °· Coughing up blood or vomiting blood. °· Stool that is black, tarry, or bright red. °DIAGNOSIS °Your health care provider will take a medical history and perform a physical exam. You may also have other tests, including: °· An endoscopy to examine your stomach and esophagus with a small camera. °· A test that measures the acidity level in your esophagus. °· A test that measures how much pressure is on your esophagus. °· A barium swallow or modified barium swallow to show the shape, size, and functioning of your esophagus. °· Allergy  tests. °TREATMENT °Treatment for this condition depends on the cause of your esophagitis. In some cases, steroids or other medicines may be given to help relieve your symptoms or to treat the underlying cause of your condition. You may have to make some lifestyle changes, such as: °· Avoiding alcohol. °· Quitting smoking. °· Changing your diet. °· Exercising. °· Changing your sleep habits and your sleep environment. °HOME CARE INSTRUCTIONS °Take these actions to decrease your discomfort and to help avoid complications. °Diet °· Follow a diet as recommended by your health care provider. This may involve avoiding foods and drinks such as: °¨ Coffee and tea (with or without caffeine). °¨ Drinks that contain alcohol. °¨ Energy drinks and sports drinks. °¨ Carbonated drinks or sodas. °¨ Chocolate and cocoa. °¨ Peppermint and mint flavorings. °¨ Garlic and onions. °¨ Horseradish. °¨ Spicy and acidic foods, including peppers, chili powder, curry powder, vinegar, hot sauces, and barbecue sauce. °¨ Citrus fruit juices and citrus fruits, such as oranges, lemons, and limes. °¨ Tomato-based foods, such as red sauce, chili, salsa, and pizza with red sauce. °¨ Fried and fatty foods, such as donuts, french fries, potato chips, and high-fat dressings. °¨ High-fat meats, such as hot dogs and fatty cuts of red and white meats, such as rib eye steak, sausage, ham, and bacon. °¨ High-fat dairy items, such as whole milk, butter, and cream cheese. °· Eat small, frequent meals instead of large meals. °· Avoid drinking large amounts of liquid with your meals. °· Avoid eating meals during the 2-3 hours before bedtime. °· Avoid lying down right after you eat. °· Do not exercise right after you eat. °· Avoid foods and drinks that seem to   make your symptoms worse. °General Instructions °· Pay attention to any changes in your symptoms. °· Take over-the-counter and prescription medicines only as told by your health care provider. Do not take  aspirin, ibuprofen, or other NSAIDs unless your health care provider told you to do so. °· If you have trouble taking pills, use a pill splitter to decrease the size of the pill. This will decrease the chance of the pill getting stuck or injuring your esophagus on the way down. Also, drink water after you take a pill. °· Do not use any tobacco products, including cigarettes, chewing tobacco, and e-cigarettes. If you need help quitting, ask your health care provider. °· Wear loose-fitting clothing. Do not wear anything tight around your waist that causes pressure on your abdomen. °· Raise (elevate) the head of your bed about 6 inches (15 cm). °· Try to reduce your stress, such as with yoga or meditation. If you need help reducing stress, ask your health care provider. °· If you are overweight, reduce your weight to an amount that is healthy for you. Ask your health care provider for guidance about a safe weight loss goal. °· Keep all follow-up visits as told by your health care provider. This is important. °SEEK MEDICAL CARE IF: °· You have new symptoms. °· You have unexplained weight loss. °· You have difficulty swallowing, or it hurts to swallow. °· You have wheezing or a persistent cough. °· Your symptoms do not improve with treatment. °· You have frequent heartburn for more than two weeks. °SEEK IMMEDIATE MEDICAL CARE IF: °· You have severe pain in your arms, neck, jaw, teeth, or back. °· You feel sweaty, dizzy, or light-headed. °· You have chest pain or shortness of breath. °· You vomit and your vomit looks like blood or coffee grounds. °· Your stool is bloody or black. °· You have a fever. °· You cannot swallow, drink, or eat. °  °This information is not intended to replace advice given to you by your health care provider. Make sure you discuss any questions you have with your health care provider. °  °Document Released: 10/30/2004 Document Revised: 06/13/2015 Document Reviewed: 01/17/2015 °Elsevier Interactive  Patient Education ©2016 [employer] ° °

## 2016-06-30 NOTE — Telephone Encounter (Signed)
Patient called stating she has had intermittent tightness in her chest for 3-4 days. Patient has also been short of breath and had some wheezing. Patient denies any numbness, tingling, fever, chills, body aches, or fever. She states she has had more heartburn lately. The tightness in her chest seems to sometimes go into her throat. Patient was last seen by Dr. Caryn Section on 06/11/2016. She reports that at that visit she was having tightness in her chest on the right side that felt like a cool burning sensation in one spot. Appointment has been made today at 3:30pm.

## 2016-07-04 ENCOUNTER — Other Ambulatory Visit: Payer: Self-pay | Admitting: Family Medicine

## 2016-07-04 NOTE — Telephone Encounter (Signed)
Has appointment 07/09/2016. Renaldo Fiddler, CMA

## 2016-07-09 ENCOUNTER — Ambulatory Visit: Payer: BLUE CROSS/BLUE SHIELD | Admitting: Family Medicine

## 2016-07-23 ENCOUNTER — Ambulatory Visit: Payer: BLUE CROSS/BLUE SHIELD | Admitting: Family Medicine

## 2016-09-15 ENCOUNTER — Ambulatory Visit (INDEPENDENT_AMBULATORY_CARE_PROVIDER_SITE_OTHER): Payer: BLUE CROSS/BLUE SHIELD | Admitting: Family Medicine

## 2016-09-15 ENCOUNTER — Encounter: Payer: Self-pay | Admitting: Family Medicine

## 2016-09-15 VITALS — BP 130/88 | HR 62 | Temp 97.7°F | Resp 16 | Ht 66.75 in | Wt 251.0 lb

## 2016-09-15 DIAGNOSIS — M533 Sacrococcygeal disorders, not elsewhere classified: Secondary | ICD-10-CM | POA: Diagnosis not present

## 2016-09-15 DIAGNOSIS — K219 Gastro-esophageal reflux disease without esophagitis: Secondary | ICD-10-CM

## 2016-09-15 DIAGNOSIS — S29012A Strain of muscle and tendon of back wall of thorax, initial encounter: Secondary | ICD-10-CM

## 2016-09-15 DIAGNOSIS — R062 Wheezing: Secondary | ICD-10-CM | POA: Diagnosis not present

## 2016-09-15 DIAGNOSIS — E538 Deficiency of other specified B group vitamins: Secondary | ICD-10-CM

## 2016-09-15 DIAGNOSIS — Z23 Encounter for immunization: Secondary | ICD-10-CM

## 2016-09-15 DIAGNOSIS — G8929 Other chronic pain: Secondary | ICD-10-CM

## 2016-09-15 DIAGNOSIS — Z0000 Encounter for general adult medical examination without abnormal findings: Secondary | ICD-10-CM | POA: Diagnosis not present

## 2016-09-15 DIAGNOSIS — C4491 Basal cell carcinoma of skin, unspecified: Secondary | ICD-10-CM | POA: Diagnosis not present

## 2016-09-15 MED ORDER — CYCLOBENZAPRINE HCL 5 MG PO TABS: 5.0000 mg | ORAL_TABLET | Freq: Three times a day (TID) | ORAL | 1 refills | Status: DC | PRN

## 2016-09-15 NOTE — Progress Notes (Signed)
Patient: Heather Bauer, Female    DOB: [birth year], 50 y.o.   MRN: [id number] Visit Date: 09/15/2016  Today's Provider: Lelon Huh, MD   Chief Complaint  Patient presents with  . Annual Exam  . Hyperglycemia    Follow up  . Anemia    Follow up   Subjective:    Annual physical exam Heather Bauer is a 50 y.o. female who presents today for health maintenance and complete physical. She feels well . She reports never exercising. She reports she is sleeping poorly.  Had neurology evaluation in September for muscle fasciculations/spasm. Had labs including A1c=6.1. Negative ANA and sed rate. Low B12=269(n>300) Normal thyroid functions.   Dr. Melrose Nakayama starter on gabapentin and has improved somewhat -----------------------------------------------------------------  Prediabetes, Follow-up:   Lab Results  Component Value Date   HGBA1C 6.0 03/06/2016   HGBA1C 6.2 (H) 09/14/2015   GLUCOSE 95 09/14/2015    Last seen for for this6 months ago.  Management since that visit includes advising patient to avoid sweets and starchy foods. Current symptoms include none and have been stable.  Weight trend: stable Prior visit with dietician: no Current diet: well balanced Current exercise: none  Pertinent Labs:    Component Value Date/Time   CHOL 143 09/11/2014   TRIG 146 09/11/2014   CREATININE 0.79 09/14/2015 1607    Wt Readings from Last 3 Encounters:  06/30/16 254 lb (115.2 kg)  06/11/16 253 lb (114.8 kg)  03/06/16 248 lb (112.5 kg)   Follow up Iron Deficiency Anemia: Patient was last seen for this problem 6 months ago. Management during that visit includes advising patient to try increasing multivitamin to 2 daily since she did not tolerate oral iron replacement. Patient comes in today reporting that she has been taking 2 multivitamins daily.   Lab Results  Component Value Date   FERRITIN 30 09/14/2015   Lab Results  Component Value Date   HGB 11.8 (A) 09/11/2014       Wheezing  She also reports that she has wheezing in her chest every night when she lays down. States she sometimes feels short winded on exertion, but doesn't wheeze at those times. States that she has been told in the past that she has "asthma like symptoms"  She also reports occasional episodes of heartburn for which she take OTC Tums. Has been given samples of Dexilant in the past but she doesn't take them.   She also complains of recurrent right posterior hip pain and pain between her shoulders blades.    Review of Systems  Constitutional: Negative for chills, fatigue and fever.  HENT: Negative for congestion, ear pain, rhinorrhea, sneezing and sore throat.   Eyes: Negative.  Negative for pain and redness.  Respiratory: Positive for cough. Negative for shortness of breath and wheezing.   Cardiovascular: Negative for chest pain and leg swelling.  Gastrointestinal: Negative for abdominal pain, blood in stool, constipation, diarrhea and nausea.  Endocrine: Negative for polydipsia and polyphagia.  Genitourinary: Negative.  Negative for dysuria, flank pain, hematuria, pelvic pain, vaginal bleeding and vaginal discharge.  Musculoskeletal: Positive for myalgias. Negative for arthralgias, back pain, gait problem and joint swelling.  Skin: Negative for rash.  Neurological: Negative.  Negative for dizziness, tremors, seizures, weakness, light-headedness, numbness and headaches.  Hematological: Negative for adenopathy.  Psychiatric/Behavioral: Negative.  Negative for behavioral problems, confusion and dysphoric mood. The patient is not nervous/anxious and is not hyperactive.     Social History  She  reports that she has quit smoking. Her smoking use included Cigarettes. She has a 3.50 pack-year smoking history. She has never used smokeless tobacco. She reports that she drinks alcohol. She reports that she does not use drugs.       Social History   Social History  . Marital status:  Married    Spouse name: N/A  . Number of children: 2  . Years of education: N/A   Occupational History  . [employer]    Social History Main Topics  . Smoking status: Former Smoker    Packs/day: 0.50    Years: 7.00    Types: Cigarettes  . Smokeless tobacco: Never Used  . Alcohol use 0.0 oz/week     Comment: occasionally  . Drug use: No  . Sexual activity: Not Asked   Other Topics Concern  . None   Social History Narrative  . None    Past Medical History:  Diagnosis Date  . Asthma   . Cancer ([place])    skin cancer, basal cell  . GERD (gastroesophageal reflux disease)   . HTN (hypertension)      Patient Active Problem List   Diagnosis Date Noted  . Restless leg syndrome 06/11/2016  . Abdominal bloating 03/06/2016  . Adnexal pain 09/14/2015  . Iron deficiency 09/14/2015  . Fasciculations 09/14/2015  . Acne vulgaris 03/08/2015  . Basal cell carcinoma of skin 03/08/2015  . Back pain, thoracic 03/08/2015  . Chronic gastritis 03/08/2015  . Blood pressure elevated 03/08/2015  . Esophageal reflux 03/08/2015  . Cephalalgia 03/08/2015  . Urticaria 03/08/2015  . Adult BMI 30+ 03/08/2015  . Polycystic ovaries 03/08/2015  . Borderline diabetes 03/08/2015    Past Surgical History:  Procedure Laterality Date  . ADENOIDECTOMY  01/2001  . BASAL CELL CARCINOMA EXCISION  01/06/2008  . POLYPECTOMY     benign  . TONSILLECTOMY  1980    Family History        Family Status  Relation Status  . Mother Alive  . Father Alive  . Sister Alive  . Maternal Aunt Alive  . Maternal Grandmother Alive  . Maternal Grandfather Alive        Her family history includes COPD in her father; Colon cancer in her maternal aunt; Diabetes in her maternal grandmother; Fibrocystic breast disease in her sister; Berenice Primas' disease in her mother; Heart attack in her maternal grandfather and maternal grandmother; Heart disease in her maternal grandfather; Hyperlipidemia in her father; Hypertension in  her father, maternal grandfather, and mother; Uterine cancer in her mother.     Allergies  Allergen Reactions  . Methocarbamol Hives  . Sulfa Antibiotics     Fever  . Tape Other (See Comments)    Latex tape     Current Outpatient Prescriptions:  .  gabapentin (NEURONTIN) 100 MG capsule, Take 1 capsule twice a day for 1 week, then increase to 1 capsule three times a day., Disp: , Rfl:  .  loratadine (CLARITIN) 10 MG tablet, Take 1 tablet by mouth daily as needed. , Disp: , Rfl:  .  Multiple Vitamins-Minerals (MULTIVITAMIN GUMMIES WOMENS PO), Take 2 Doses by mouth daily., Disp: , Rfl:  .  propranolol (INDERAL) 40 MG tablet, TAKE 1 TABLET BY MOUTH EVERY 12 HOURS, Disp: 60 tablet, Rfl: 6 .  vitamin B-12 (CYANOCOBALAMIN) 100 MCG tablet, Take 100 mcg by mouth daily., Disp: , Rfl:    Patient Care Team: [name], MD as PCP - General (Family Medicine)  Objective:   Vitals: BP 130/88 (BP Location: Left Arm, Patient Position: Sitting, Cuff Size: Large)   Pulse 62   Temp 97.7 F (36.5 C) (Oral)   Resp 16   Ht 5' 6.75" (1.695 m)   Wt 251 lb (113.9 kg)   SpO2 98% Comment: room air  BMI 39.61 kg/m    Physical Exam  General Appearance:    Alert, cooperative, no distress, obese  Eyes:    PERRL, conjunctiva/corneas clear, EOM's intact       Lungs:     Clear to auscultation bilaterally, respirations unlabored  Heart:    Regular rate and rhythm  Neurologic:   Awake, alert, oriented x 3. No apparent focal neurological           defect.        Depression Screen PHQ 2/9 Scores 09/15/2016  PHQ - 2 Score 0  PHQ- 9 Score 2      Assessment & Plan:     Routine Health Maintenance and Physical Exam  Exercise Activities and Dietary recommendations Goals    None      Immunization History  Administered Date(s) Administered  . Influenza,inj,Quad PF,36+ Mos 09/14/2015  . Influenza-Unspecified 08/17/2014  . Td 08/21/2004  . Tdap 09/12/2011    Health Maintenance  Topic  Date Due  . HIV Screening  10/02/1983  . PAP SMEAR  09/28/2011  . INFLUENZA VACCINE  05/06/2016  . TETANUS/TDAP  09/11/2021     Discussed health benefits of physical activity, and encouraged her to engage in regular exercise appropriate for her age and condition.    -------------------------------------------------------------------- 1. Annual physical exam Doing well. UTD on screening labs. Gyn per Dr. Romelle Starcher  2. B12 deficiency Follow up Dr. Melrose Nakayama Anticipate rechecking B12 levels at follow up visit later this month.   3. Gastroesophageal reflux disease without esophagitis Take OTC antacids.   4. Wheezing Normal spirometry today.  ? GERD versus RAD versus Allergies. She has some Dexilant at home Try montelukast 10mg  daily.   - Spirometry with graph - Spirometry with graph; Future - Spirometry with graph  5. Need for influenza vaccination  - Flu Vaccine QUAD 36+ mos IM  6. Basal cell carcinoma, unspecified site Follow up Dr. Phillip Heal yearly as scheduled.   7. Upper back strain, initial encounter  - cyclobenzaprine (FLEXERIL) 5 MG tablet; Take 1 tablet (5 mg total) by mouth 3 (three) times daily as needed for muscle spasms.  Dispense: 30 tablet; Refill: 1  8. Chronic right SI joint pain Offered orthopedic referral. She is going to try applying heat on a daily basis and will call back for referral if she changes her mind.      Lelon Huh, MD  [facility]

## 2016-10-03 ENCOUNTER — Encounter: Payer: Self-pay | Admitting: Family Medicine

## 2016-10-03 ENCOUNTER — Ambulatory Visit (INDEPENDENT_AMBULATORY_CARE_PROVIDER_SITE_OTHER): Payer: BLUE CROSS/BLUE SHIELD | Admitting: Family Medicine

## 2016-10-03 VITALS — BP 130/84 | HR 72 | Temp 98.3°F | Resp 16 | Wt 251.6 lb

## 2016-10-03 DIAGNOSIS — B9789 Other viral agents as the cause of diseases classified elsewhere: Secondary | ICD-10-CM | POA: Diagnosis not present

## 2016-10-03 DIAGNOSIS — J069 Acute upper respiratory infection, unspecified: Secondary | ICD-10-CM | POA: Diagnosis not present

## 2016-10-03 MED ORDER — AMOXICILLIN-POT CLAVULANATE 875-125 MG PO TABS: 1.0000 | ORAL_TABLET | Freq: Two times a day (BID) | ORAL | 0 refills | Status: DC

## 2016-10-03 NOTE — Progress Notes (Signed)
Subjective:     Patient ID: Heather Bauer, female   DOB: Sep 30, 1966, 50 y.o.   MRN: [id number]  HPI  Chief Complaint  Patient presents with  . URI    Patient complains of cough, congestion, sore throat, and low grade fever started 4-5 days ago. Patient has been taking NSAIDS and Mucinex to help with symptoms   Reports symptom onset 12/25. States she also has a left over cough from cold she had around Thanksgiving.   Review of Systems     Objective:   Physical Exam  Constitutional: She appears well-developed and well-nourished. No distress.  Ears: T.M's intact without inflammation Throat: tonsils absent Neck: bilateral anterior cervical nodes Lungs: clear     Assessment:    1. Viral upper respiratory tract infection - amoxicillin-clavulanate (AUGMENTIN) 875-125 MG tablet; Take 1 tablet by mouth 2 (two) times daily.  Dispense: 20 tablet; Refill: 0    Plan:    Discussed continued symptomatic treatment. Patient defers narcotic cough syrup. Instructed to start abx after the next 3 days if not improving.

## 2016-10-03 NOTE — Patient Instructions (Signed)
Discussed use of Mucinex D and Delsym. Start antibiotic if not improving as you start the New Year.

## 2016-11-21 ENCOUNTER — Encounter: Payer: Self-pay | Admitting: *Deleted

## 2016-11-24 ENCOUNTER — Ambulatory Visit: Payer: BLUE CROSS/BLUE SHIELD | Admitting: Certified Registered"

## 2016-11-24 ENCOUNTER — Encounter: Payer: Self-pay | Admitting: *Deleted

## 2016-11-24 ENCOUNTER — Ambulatory Visit
Admission: RE | Admit: 2016-11-24 | Discharge: 2016-11-24 | Disposition: A | Discharge status: Home / Self Care | Payer: BLUE CROSS/BLUE SHIELD | Source: Ambulatory Visit | Attending: Gastroenterology | Admitting: Gastroenterology

## 2016-11-24 ENCOUNTER — Encounter
Admission: RE | Disposition: A | Discharge status: Home / Self Care | Payer: Self-pay | Source: Ambulatory Visit | Attending: Gastroenterology

## 2016-11-24 DIAGNOSIS — I10 Essential (primary) hypertension: Secondary | ICD-10-CM | POA: Diagnosis not present

## 2016-11-24 DIAGNOSIS — Q439 Congenital malformation of intestine, unspecified: Secondary | ICD-10-CM | POA: Diagnosis not present

## 2016-11-24 DIAGNOSIS — Z79899 Other long term (current) drug therapy: Secondary | ICD-10-CM | POA: Insufficient documentation

## 2016-11-24 DIAGNOSIS — Z87891 Personal history of nicotine dependence: Secondary | ICD-10-CM | POA: Insufficient documentation

## 2016-11-24 DIAGNOSIS — Z1211 Encounter for screening for malignant neoplasm of colon: Secondary | ICD-10-CM | POA: Insufficient documentation

## 2016-11-24 DIAGNOSIS — K635 Polyp of colon: Secondary | ICD-10-CM | POA: Diagnosis not present

## 2016-11-24 DIAGNOSIS — Z8371 Family history of colonic polyps: Secondary | ICD-10-CM | POA: Diagnosis not present

## 2016-11-24 HISTORY — PX: COLONOSCOPY WITH PROPOFOL: SHX5780

## 2016-11-24 LAB — POCT PREGNANCY, URINE: PREG TEST UR: NEGATIVE

## 2016-11-24 SURGERY — COLONOSCOPY WITH PROPOFOL
Anesthesia: General

## 2016-11-24 MED ORDER — PROPOFOL 10 MG/ML IV BOLUS
INTRAVENOUS | Status: DC | PRN
  Administered 2016-11-24: 70 mg via INTRAVENOUS

## 2016-11-24 MED ORDER — LIDOCAINE HCL (PF) 2 % IJ SOLN
INTRAMUSCULAR | Status: AC
  Filled 2016-11-24: qty 2

## 2016-11-24 MED ORDER — SODIUM CHLORIDE 0.9 % IV SOLN
INTRAVENOUS | Status: DC
  Administered 2016-11-24 (×2): via INTRAVENOUS

## 2016-11-24 MED ORDER — PROPOFOL 10 MG/ML IV BOLUS
INTRAVENOUS | Status: AC
  Filled 2016-11-24: qty 20

## 2016-11-24 MED ORDER — PROPOFOL 500 MG/50ML IV EMUL
INTRAVENOUS | Status: DC | PRN
  Administered 2016-11-24: 150 ug/kg/min via INTRAVENOUS

## 2016-11-24 MED ORDER — PROPOFOL 10 MG/ML IV BOLUS
INTRAVENOUS | Status: AC
  Filled 2016-11-24: qty 40

## 2016-11-24 MED ORDER — SODIUM CHLORIDE 0.9 % IV SOLN: INTRAVENOUS | Status: DC

## 2016-11-24 MED ORDER — LIDOCAINE HCL (PF) 2 % IJ SOLN
INTRAMUSCULAR | Status: DC | PRN
  Administered 2016-11-24: 80 mg via INTRADERMAL

## 2016-11-24 NOTE — Transfer of Care (Signed)
Immediate Anesthesia Transfer of Care Note  Patient: Heather Bauer  Procedure(s) Performed: Procedure(s): COLONOSCOPY WITH PROPOFOL (N/A)  Patient Location: PACU  Anesthesia Type:General  Level of Consciousness: sedated and responds to stimulation  Airway & Oxygen Therapy: Patient Spontanous Breathing and Patient connected to nasal cannula oxygen  Post-op Assessment: Report given to RN and Post -op Vital signs reviewed and stable  Post vital signs: Reviewed and stable  Last Vitals:  Vitals:   11/24/16 1351 11/24/16 1520  BP: (!) 138/91 110/71  Pulse: 76 85  Resp: 16 18  Temp: 37.9 C     Last Pain:  Vitals:   11/24/16 1351  TempSrc: Tympanic         Complications: No apparent anesthesia complications

## 2016-11-24 NOTE — Anesthesia Preprocedure Evaluation (Signed)
Anesthesia Evaluation  Patient identified by MRN, date of birth, ID band Patient awake    Reviewed: Allergy & Precautions, NPO status , Patient's Chart, lab work & pertinent test results  History of Anesthesia Complications Negative for: history of anesthetic complications  Airway Mallampati: II  TM Distance: >3 FB Neck ROM: Full    Dental no notable dental hx.    Pulmonary asthma , former smoker,    breath sounds clear to auscultation- rhonchi (-) wheezing      Cardiovascular hypertension, Pt. on medications (-) CAD and (-) Past MI  Rhythm:Regular Rate:Normal - Systolic murmurs and - Diastolic murmurs    Neuro/Psych  Headaches, negative psych ROS   GI/Hepatic Neg liver ROS, GERD  ,  Endo/Other  negative endocrine ROSneg diabetes  Renal/GU negative Renal ROS     Musculoskeletal negative musculoskeletal ROS (+)   Abdominal (+) + obese,   Peds  Hematology negative hematology ROS (+)   Anesthesia Other Findings Past Medical History: No date: Asthma No date: Cancer ([place])     Comment: skin cancer, basal cell No date: GERD (gastroesophageal reflux disease) No date: HTN (hypertension)   Reproductive/Obstetrics                             Anesthesia Physical Anesthesia Plan  ASA: II  Anesthesia Plan: General   Post-op Pain Management:    Induction: Intravenous  Airway Management Planned: Natural Airway  Additional Equipment:   Intra-op Plan:   Post-operative Plan:   Informed Consent: I have reviewed the patients History and Physical, chart, labs and discussed the procedure including the risks, benefits and alternatives for the proposed anesthesia with the patient or authorized representative who has indicated his/her understanding and acceptance.   Dental advisory given  Plan Discussed with: CRNA and Anesthesiologist  Anesthesia Plan Comments:         Anesthesia Quick  Evaluation

## 2016-11-24 NOTE — Anesthesia Procedure Notes (Signed)
Performed by: Aide Wojnar Pre-anesthesia Checklist: Patient identified, Emergency Drugs available, Suction available, Patient being monitored and Timeout performed Patient Re-evaluated:Patient Re-evaluated prior to inductionOxygen Delivery Method: Nasal cannula Preoxygenation: Pre-oxygenation with 100% oxygen Intubation Type: IV induction       

## 2016-11-24 NOTE — Op Note (Signed)
[facility] Patient Name: Heather Bauer Procedure Date: 11/24/2016 2:40 PM MRN: [id number] Account #: [id number] Date of Birth: 1966-04-09 Admit Type: Outpatient Age: 51 Room: [facility] ENDO ROOM 3 Gender: Female Note Status: Finalized Procedure:            Colonoscopy Indications:          Family history of colonic polyps in a first-degree                        relative Providers:            Lollie Sails, MD Referring MD:         Kirstie Peri. Caryn Section, MD (Referring MD) Medicines:            Monitored Anesthesia Care Complications:        No immediate complications. Procedure:            Pre-Anesthesia Assessment:                       - ASA Grade Assessment: II - A patient with mild                        systemic disease.                       After obtaining informed consent, the colonoscope was                        passed under direct vision. Throughout the procedure,                        the patient's blood pressure, pulse, and oxygen                        saturations were monitored continuously. The                        Colonoscope was introduced through the anus and                        advanced to the the cecum, identified by appendiceal                        orifice and ileocecal valve. The colonoscopy was                        performed with moderate difficulty. Successful                        completion of the procedure was aided by changing the                        patient to a supine position, changing the patient to a                        prone position and using manual pressure. The quality                        of the bowel preparation was good. Findings:      Two, one sessile and one semi-pedunculated, polyps were found in the  recto-sigmoid colon. The polyps were 3 to 6 mm in size. These polyps       were removed with a cold snare. Resection and retrieval were complete.      The sigmoid colon, descending  colon, transverse colon and ascending       colon were significantly tortuous.      The digital rectal exam was normal. Impression:           - Two 3 to 6 mm polyps at the recto-sigmoid colon,                        removed with a cold snare. Resected and retrieved.                       - Tortuous colon. Recommendation:       - Discharge patient to home. 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 All rights reserved. The codes documented in this report are preliminary and upon coder review may  be revised to meet current compliance requirements. Lollie Sails, MD 11/24/2016 3:20:41 PM This report has been signed electronically. Number of Addenda: 0 Note Initiated On: 11/24/2016 2:40 PM Scope Withdrawal Time: 0 hours 12 minutes 52 seconds  Total Procedure Duration: 0 hours 28 minutes 52 seconds       [facility]

## 2016-11-24 NOTE — H&P (Signed)
Outpatient short stay form Pre-procedure 11/24/2016 2:34 PM Heather Sails MD  Primary Physician: Dr. Lelon Huh  Reason for visit:  Colonoscopy  History of present illness:  Patient is a 51 year old female presenting today as above. She has a family history of colon polyps in several primary relatives. Tolerated her prep well. She takes no aspirin or blood thinning agents. He does have a history of hemorrhoid that was treated and resolved.    Current Facility-Administered Medications:  .  0.9 %  sodium chloride infusion, , Intravenous, Continuous, Heather Sails, MD, Last Rate: 20 mL/hr at 11/24/16 1411 .  0.9 %  sodium chloride infusion, , Intravenous, Continuous, Heather Sails, MD  Prescriptions Prior to Admission  Medication Sig Dispense Refill Last Dose  . loratadine (CLARITIN) 10 MG tablet Take 1 tablet by mouth daily as needed.    Past Week at Unknown time  . Multiple Vitamins-Minerals (MULTIVITAMIN GUMMIES WOMENS PO) Take 2 Doses by mouth daily.   Past Week at Unknown time  . propranolol (INDERAL) 40 MG tablet TAKE 1 TABLET BY MOUTH EVERY 12 HOURS 60 tablet 6 11/24/2016 at 0600  . simethicone (MYLICON) 80 MG chewable tablet Chew 80 mg by mouth every 6 (six) hours as needed for flatulence.   Past Month at Unknown time  . vitamin B-12 (CYANOCOBALAMIN) 100 MCG tablet Take 100 mcg by mouth daily.   Past Week at Unknown time  . amoxicillin-clavulanate (AUGMENTIN) 875-125 MG tablet Take 1 tablet by mouth 2 (two) times daily. (Patient not taking: Reported on 11/24/2016) 20 tablet 0 Completed Course at Unknown time  . cyclobenzaprine (FLEXERIL) 5 MG tablet Take 1 tablet (5 mg total) by mouth 3 (three) times daily as needed for muscle spasms. (Patient not taking: Reported on 11/24/2016) 30 tablet 1 Completed Course at Unknown time  . dexlansoprazole (DEXILANT) 60 MG capsule Take 60 mg by mouth daily.   Not Taking at Unknown time  . gabapentin (NEURONTIN) 100 MG capsule Take 1  capsule twice a day for 1 week, then increase to 1 capsule three times a day.   Not Taking at Unknown time     Allergies  Allergen Reactions  . Methocarbamol Hives  . Sulfa Antibiotics     Fever  . Tape Other (See Comments)    Latex tape     Past Medical History:  Diagnosis Date  . Asthma   . Cancer ([place])    skin cancer, basal cell  . GERD (gastroesophageal reflux disease)   . HTN (hypertension)     Review of systems:      Physical Exam    Heart and lungs: Regular rate and rhythm without rub or gallop, lungs are bilaterally clear.    HEENT: Normocephalic atraumatic eyes are anicteric    Other:     Pertinant exam for procedure: Soft nontender nondistended bowel sounds positive normoactive.    Planned proceedures: Colonoscopy and indicated procedures. I have discussed the risks benefits and complications of procedures to include not limited to bleeding, infection, perforation and the risk of sedation and the patient wishes to proceed.    Heather Sails, MD Gastroenterology 11/24/2016  2:34 PM

## 2016-11-24 NOTE — Anesthesia Post-op Follow-up Note (Cosign Needed)
Anesthesia QCDR form completed.        

## 2016-11-25 ENCOUNTER — Encounter: Payer: Self-pay | Admitting: Gastroenterology

## 2016-11-25 NOTE — Anesthesia Postprocedure Evaluation (Signed)
Anesthesia Post Note  Patient: Heather Bauer  Procedure(s) Performed: Procedure(s) (LRB): COLONOSCOPY WITH PROPOFOL (N/A)  Patient location during evaluation: PACU Anesthesia Type: General Level of consciousness: awake and alert Pain management: pain level controlled Vital Signs Assessment: post-procedure vital signs reviewed and stable Respiratory status: spontaneous breathing, nonlabored ventilation, respiratory function stable and patient connected to nasal cannula oxygen Cardiovascular status: blood pressure returned to baseline and stable Postop Assessment: no signs of nausea or vomiting Anesthetic complications: no     Last Vitals:  Vitals:   11/24/16 1530 11/24/16 1537  BP: 126/76 (!) 145/99  Pulse: 75 70  Resp: (!) 24 17  Temp:      Last Pain:  Vitals:   11/25/16 0734  TempSrc:   PainSc: 0-No pain                 Molli Barrows

## 2016-11-26 LAB — SURGICAL PATHOLOGY

## 2016-12-02 ENCOUNTER — Ambulatory Visit (INDEPENDENT_AMBULATORY_CARE_PROVIDER_SITE_OTHER): Payer: BLUE CROSS/BLUE SHIELD | Admitting: Family Medicine

## 2016-12-02 ENCOUNTER — Encounter: Payer: Self-pay | Admitting: Family Medicine

## 2016-12-02 VITALS — BP 120/78 | HR 60 | Temp 98.4°F | Resp 20 | Wt 247.0 lb

## 2016-12-02 DIAGNOSIS — J329 Chronic sinusitis, unspecified: Secondary | ICD-10-CM | POA: Diagnosis not present

## 2016-12-02 MED ORDER — AZITHROMYCIN 250 MG PO TABS: ORAL_TABLET | ORAL | 0 refills | Status: AC

## 2016-12-02 NOTE — Progress Notes (Signed)
       Patient: Heather Bauer Female    DOB: 1965-10-08   50 y.o.   MRN: [id number] Visit Date: 12/02/2016  Today's Provider: Lelon Huh, MD   Chief Complaint  Patient presents with  . URI   Subjective:    URI   This is a new problem. The current episode started in the past 7 days. The problem has been gradually worsening. Maximum temperature: has felt feverish. Recorded temp up to 100 last week. The fever has been present for less than 1 day. Associated symptoms include chest pain, congestion, coughing, ear pain, headaches, rhinorrhea and sinus pain. She has tried decongestant (mucinex ) for the symptoms. The treatment provided mild relief.       Allergies  Allergen Reactions  . Methocarbamol Hives  . Sulfa Antibiotics     Fever  . Tape Other (See Comments)    Latex tape     Current Outpatient Prescriptions:  .  loratadine (CLARITIN) 10 MG tablet, Take 1 tablet by mouth daily as needed. , Disp: , Rfl:  .  Multiple Vitamins-Minerals (MULTIVITAMIN GUMMIES WOMENS PO), Take 2 Doses by mouth daily., Disp: , Rfl:  .  propranolol (INDERAL) 40 MG tablet, TAKE 1 TABLET BY MOUTH EVERY 12 HOURS, Disp: 60 tablet, Rfl: 6 .  vitamin B-12 (CYANOCOBALAMIN) 100 MCG tablet, Take 100 mcg by mouth daily., Disp: , Rfl:  .  dexlansoprazole (DEXILANT) 60 MG capsule, Take 60 mg by mouth daily., Disp: , Rfl:   Review of Systems  HENT: Positive for congestion, ear pain, rhinorrhea and sinus pain.   Respiratory: Positive for cough.   Cardiovascular: Positive for chest pain.  Neurological: Positive for headaches.    Social History  Substance Use Topics  . Smoking status: Former Smoker    Packs/day: 0.50    Years: 7.00    Types: Cigarettes    Quit date: 10/06/1993  . Smokeless tobacco: Never Used  . Alcohol use 0.0 oz/week     Comment: occasionally   Objective:   BP 120/78 (BP Location: Right Arm, Patient Position: Sitting, Cuff Size: Large)   Pulse 60   Temp 98.4 F (36.9 C)   Resp  20   Wt 247 lb (112 kg)   SpO2 98%   BMI 38.69 kg/m   Physical Exam  General Appearance:    Alert, cooperative, no distress  HENT:   generalized TM normal without fluid or infection, neck without nodes, frontal sinus tender and nasal mucosa pale and congested  Eyes:    PERRL, conjunctiva/corneas clear, EOM's intact       Lungs:     Clear to auscultation bilaterally, respirations unlabored  Heart:    Regular rate and rhythm  Neurologic:   Awake, alert, oriented x 3. No apparent focal neurological           defect.           Assessment & Plan:     1. Sinusitis, unspecified chronicity, unspecified location  - azithromycin (ZITHROMAX) 250 MG tablet; 2 by mouth today, then 1 daily for 4 days  Dispense: 6 tablet; Refill: 0  The entirety of the information documented in the History of Present Illness, Review of Systems and Physical Exam were personally obtained by me. Portions of this information were initially documented by Wilburt Finlay, CMA and reviewed by me for thoroughness and accuracy.        Lelon Huh, MD  [facility]

## 2017-02-14 ENCOUNTER — Other Ambulatory Visit: Payer: Self-pay | Admitting: Family Medicine

## 2017-03-07 IMAGING — CR DG CHEST 2V
1 series · 2 of 2 positions shown · non-contrast
Comparison: May 20, 2011.

CLINICAL DATA: Hemoptysis.

EXAM:
CHEST  2 VIEW

[Series 1: dg chest 2 view · 0.14mm/px · 2 of 2 slices shown]
[im 1/2]
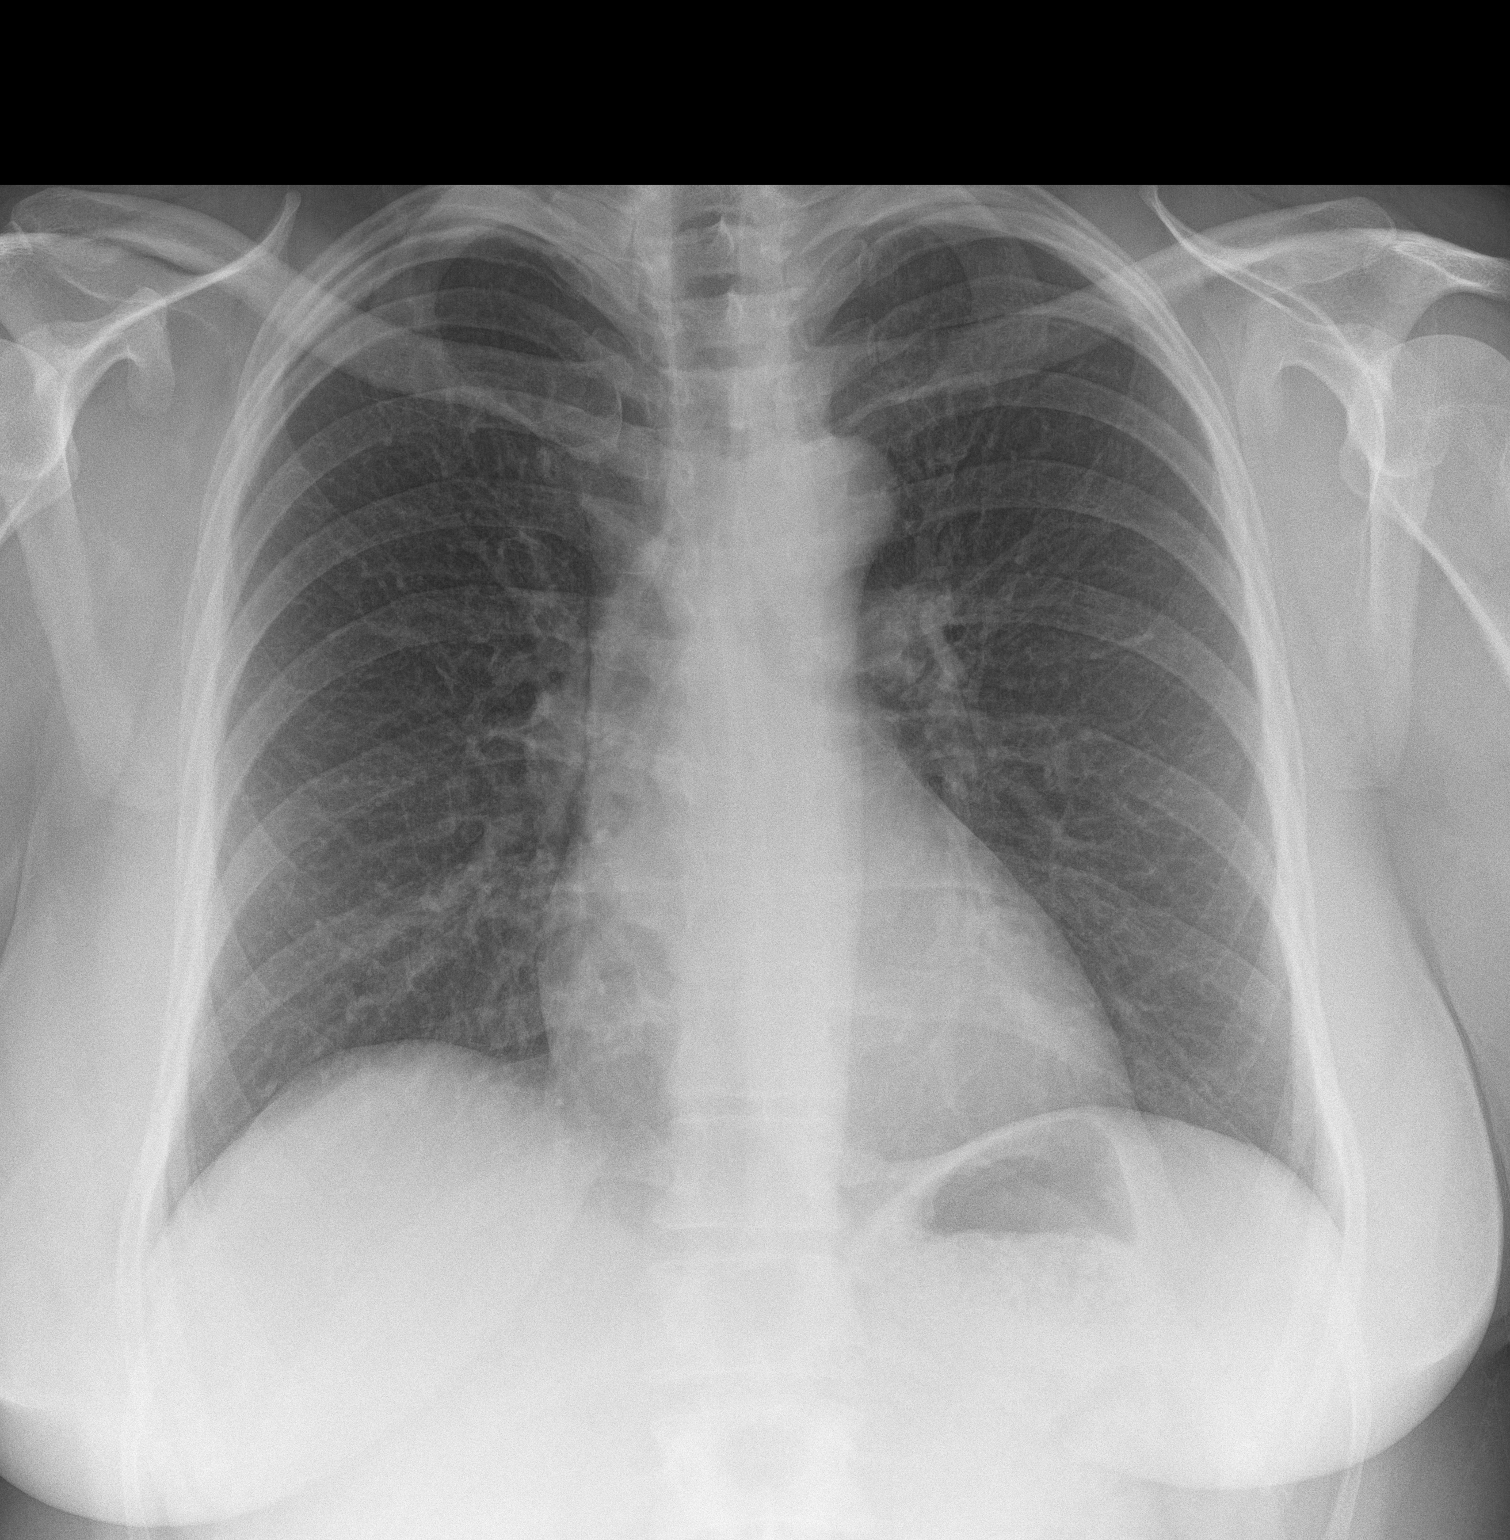
[im 2/2]
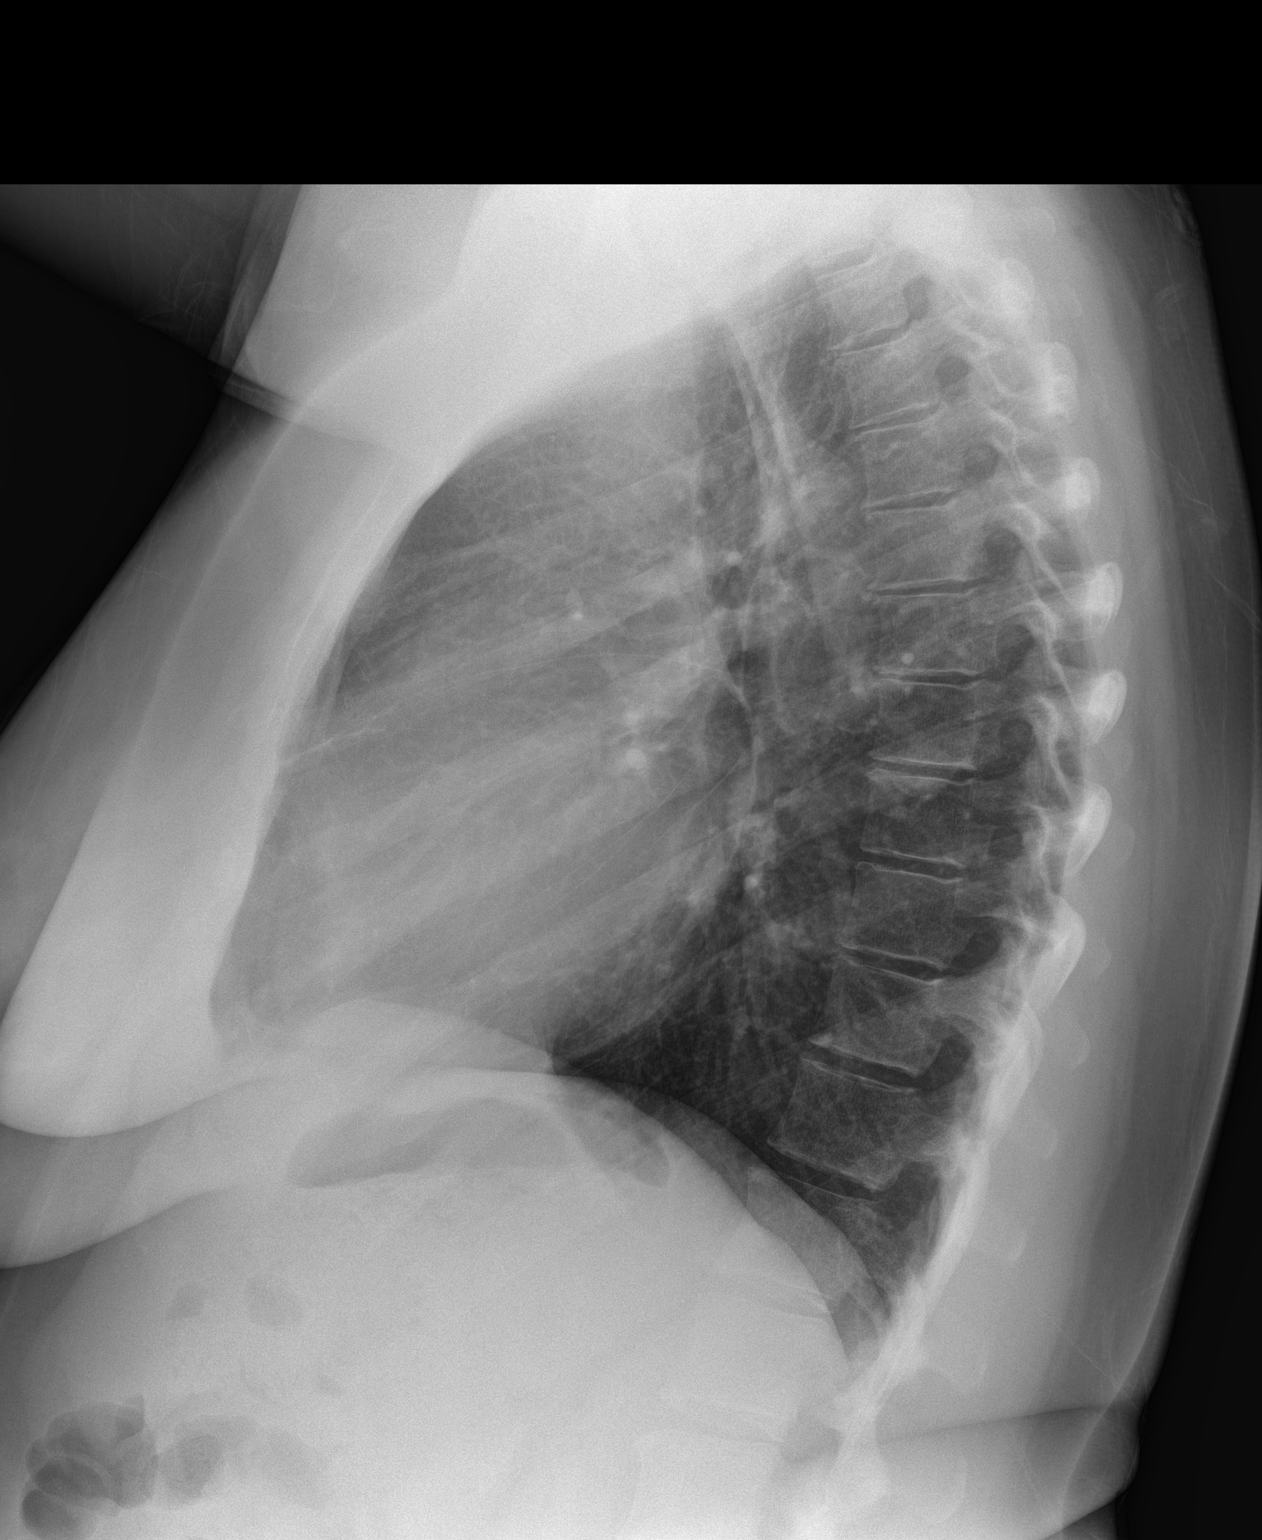

[2 of 2 positions shown; findings below may reference images not displayed]

FINDINGS: The heart size and mediastinal contours are within normal limits.
Both lungs are clear. No pneumothorax or pleural effusion is noted.
The visualized skeletal structures are unremarkable.
IMPRESSION: No active cardiopulmonary disease.

## 2017-03-13 LAB — HM MAMMOGRAPHY

## 2017-03-16 LAB — HM MAMMOGRAPHY

## 2017-03-17 ENCOUNTER — Encounter: Payer: Self-pay | Admitting: *Deleted

## 2017-08-23 ENCOUNTER — Other Ambulatory Visit: Payer: Self-pay | Admitting: Family Medicine

## 2017-09-21 ENCOUNTER — Encounter: Payer: Self-pay | Admitting: *Deleted

## 2017-09-21 ENCOUNTER — Ambulatory Visit (INDEPENDENT_AMBULATORY_CARE_PROVIDER_SITE_OTHER): Payer: Managed Care, Other (non HMO) | Admitting: Family Medicine

## 2017-09-21 ENCOUNTER — Encounter: Payer: Self-pay | Admitting: Family Medicine

## 2017-09-21 VITALS — BP 120/70 | HR 67 | Temp 97.9°F | Resp 16 | Ht 67.0 in | Wt 253.0 lb

## 2017-09-21 DIAGNOSIS — R7303 Prediabetes: Secondary | ICD-10-CM | POA: Diagnosis not present

## 2017-09-21 DIAGNOSIS — E538 Deficiency of other specified B group vitamins: Secondary | ICD-10-CM | POA: Diagnosis not present

## 2017-09-21 DIAGNOSIS — M791 Myalgia, unspecified site: Secondary | ICD-10-CM

## 2017-09-21 DIAGNOSIS — M62838 Other muscle spasm: Secondary | ICD-10-CM | POA: Diagnosis not present

## 2017-09-21 DIAGNOSIS — Z0000 Encounter for general adult medical examination without abnormal findings: Secondary | ICD-10-CM | POA: Diagnosis not present

## 2017-09-21 DIAGNOSIS — Z23 Encounter for immunization: Secondary | ICD-10-CM

## 2017-09-21 DIAGNOSIS — E611 Iron deficiency: Secondary | ICD-10-CM | POA: Diagnosis not present

## 2017-09-21 NOTE — Progress Notes (Signed)
Patient: Heather Bauer, Female    DOB: 27-Sep-1966, 51 y.o.   MRN: [id number] Visit Date: 09/21/2017  Today's Provider: Lelon Huh, MD   Chief Complaint  Patient presents with  . Annual Exam  . Gastroesophageal Reflux   Subjective:    Annual physical exam Heather Bauer is a 51 y.o. female who presents today for health maintenance and complete physical. She feels fairly well. She reports exercising none. She reports she is sleeping well.  She has routine gyn done by Dr. Leonides Schanz at [facility]. And Is up to date.  ----------------------------------------------------------------- B12 deficiency Reports she has been taking b12 supplement consistently and fasciculations have greatly improved.   Gastroesophageal reflux disease without esophagitis From 09/15/2016-no changes. Take OTC antacids.   Borderline diabetes From 03/06/2016-Improved. Avoid sweets and starches. Check a1c in 6 months. A1c was 6.0.  Iron deficiency From 09/15/2016-follow-up with Dr. Melrose Nakayama. Lab Results  Component Value Date   FERRITIN 30 09/14/2015     Review of Systems  Constitutional: Negative.   HENT: Negative.   Eyes: Negative.   Respiratory: Negative.   Cardiovascular: Negative.   Gastrointestinal: Negative.   Endocrine: Negative.   Genitourinary: Negative.   Musculoskeletal: Positive for arthralgias, back pain and myalgias.  Skin: Negative.   Allergic/Immunologic: Positive for environmental allergies and food allergies.  Neurological: Negative.   Hematological: Positive for adenopathy.  Psychiatric/Behavioral: Negative.     Social History      She  reports that she quit smoking about 23 years ago. Her smoking use included cigarettes. She has a 3.50 pack-year smoking history. she has never used smokeless tobacco. She reports that she drinks alcohol. She reports that she does not use drugs.       Social History   Socioeconomic History  . Marital status: Married    Spouse name: None  .  Number of children: 2  . Years of education: None  . Highest education level: None  Social Needs  . Financial resource strain: None  . Food insecurity - worry: None  . Food insecurity - inability: None  . Transportation needs - medical: None  . Transportation needs - non-medical: None  Occupational History  . Occupation: Radiation protection practitioner  Tobacco Use  . Smoking status: Former Smoker    Packs/day: 0.50    Years: 7.00    Pack years: 3.50    Types: Cigarettes    Last attempt to quit: 10/06/1993    Years since quitting: 23.9  . Smokeless tobacco: Never Used  Substance and Sexual Activity  . Alcohol use: Yes    Alcohol/week: 0.0 oz    Comment: occasionally  . Drug use: No  . Sexual activity: None  Other Topics Concern  . None  Social History Narrative  . None    Past Medical History:  Diagnosis Date  . Asthma   . Cancer ([place])    skin cancer, basal cell  . GERD (gastroesophageal reflux disease)   . HTN (hypertension)      Patient Active Problem List   Diagnosis Date Noted  . B12 deficiency 09/15/2016  . Wheezing 09/15/2016  . Muscle spasm 06/20/2016  . Restless leg syndrome 06/11/2016  . Abdominal bloating 03/06/2016  . Adnexal pain 09/14/2015  . Iron deficiency 09/14/2015  . Fasciculations 09/14/2015  . Acne vulgaris 03/08/2015  . Basal cell carcinoma of skin 03/08/2015  . Back pain, thoracic 03/08/2015  . Chronic gastritis 03/08/2015  . Blood pressure elevated 03/08/2015  . Esophageal  reflux 03/08/2015  . Cephalalgia 03/08/2015  . Urticaria 03/08/2015  . Adult BMI 30+ 03/08/2015  . Polycystic ovaries 03/08/2015  . Borderline diabetes 03/08/2015    Past Surgical History:  Procedure Laterality Date  . ADENOIDECTOMY  01/2001  . BASAL CELL CARCINOMA EXCISION  01/06/2008  . COLONOSCOPY WITH PROPOFOL N/A 11/24/2016   Procedure: COLONOSCOPY WITH PROPOFOL;  Surgeon: Lollie Sails, MD;  Location: [facility] ENDOSCOPY;  Service: Endoscopy;  Laterality: N/A;  . POLYPECTOMY      benign  . TONSILLECTOMY  1980    Family History        Family Status  Relation Name Status  . Mother  Alive  . Father  Alive  . Sister  Alive  . [employer]  . MGM  Alive  . MGF  Alive        Her family history includes COPD in her father; Colon cancer in her maternal aunt; Diabetes in her maternal grandmother; Fibrocystic breast disease in her sister; Berenice Primas' disease in her mother; Heart attack in her maternal grandfather and maternal grandmother; Heart disease in her maternal grandfather; Hyperlipidemia in her father; Hypertension in her father, maternal grandfather, and mother; Uterine cancer in her mother.     Allergies  Allergen Reactions  . Methocarbamol Hives  . Sulfa Antibiotics     Fever  . Tape Other (See Comments)    Latex tape     Current Outpatient Medications:  .  loratadine (CLARITIN) 10 MG tablet, Take 1 tablet by mouth daily as needed. , Disp: , Rfl:  .  Multiple Vitamins-Minerals (MULTIVITAMIN GUMMIES WOMENS PO), Take 2 Doses by mouth daily., Disp: , Rfl:  .  propranolol (INDERAL) 40 MG tablet, TAKE 1 TABLET BY MOUTH EVERY 12 HOURS, Disp: 60 tablet, Rfl: 2 .  vitamin B-12 (CYANOCOBALAMIN) 100 MCG tablet, Take 100 mcg by mouth daily., Disp: , Rfl:    Patient Care Team: [name], MD as PCP - General (Family Medicine) Jannet Mantis, MD (Dermatology) Schermerhorn, Gwen Her, MD as Referring Physician (Obstetrics and Gynecology)      Objective:   Vitals: BP 120/70 (BP Location: Right Arm, Patient Position: Sitting, Cuff Size: Large)   Pulse 67   Temp 97.9 F (36.6 C) (Oral)   Resp 16   Ht '5\' 7"'$  (1.702 m)   Wt 253 lb (114.8 kg)   SpO2 99%   BMI 39.63 kg/m    Vitals:   09/21/17 1405  BP: 120/70  Pulse: 67  Resp: 16  Temp: 97.9 F (36.6 C)  TempSrc: Oral  SpO2: 99%  Weight: 253 lb (114.8 kg)  Height: '5\' 7"'$  (1.702 m)     Physical Exam   General Appearance:    Alert, cooperative, no distress, appears stated age,  morbidly obese  Head:    Normocephalic, without obvious abnormality, atraumatic  Eyes:    PERRL, conjunctiva/corneas clear, EOM's intact, fundi    benign, both eyes  Ears:    Normal TM's and external ear canals, both ears  Nose:   Nares normal, septum midline, mucosa normal, no drainage    or sinus tenderness  Throat:   Lips, mucosa, and tongue normal; teeth and gums normal  Neck:   Supple, symmetrical, trachea midline, no adenopathy;    thyroid:  no enlargement/tenderness/nodules; no carotid   bruit or JVD  Back:     Symmetric, no curvature, ROM normal, no CVA tenderness  Lungs:     Clear to auscultation bilaterally, respirations  unlabored  Chest Wall:    No tenderness or deformity   Heart:    Regular rate and rhythm, S1 and S2 normal, no murmur, rub   or gallop  Breast Exam:    deferred  Abdomen:     Soft, non-tender, bowel sounds active all four quadrants,    no masses, no organomegaly  Pelvic:    deferred  Extremities:   Extremities normal, atraumatic, no cyanosis or edema. Diffusely tender across both mid shoulders and bilateral lower lateral back muscles.   Pulses:   2+ and symmetric all extremities  Skin:   Skin color, texture, turgor normal, no rashes or lesions  Lymph nodes:   Cervical, supraclavicular, and axillary nodes normal  Neurologic:   CNII-XII intact, normal strength, sensation and reflexes    throughout    Depression Screen PHQ 2/9 Scores 09/21/2017 09/15/2016  PHQ - 2 Score 0 0  PHQ- 9 Score 5 2      Assessment & Plan:     Routine Health Maintenance and Physical Exam  Exercise Activities and Dietary recommendations Goals    None      Immunization History  Administered Date(s) Administered  . Influenza Split 08/27/2010, 09/12/2011, 08/17/2012  . Influenza,inj,Quad PF,6+ Mos 09/07/2013, 09/11/2014, 09/14/2015, 09/15/2016  . Influenza-Unspecified 08/17/2014, 09/20/2015  . Td 08/21/2004  . Tdap 09/12/2011    Health Maintenance  Topic Date Due    . HIV Screening  10/02/1983  . PAP SMEAR  09/28/2011  . INFLUENZA VACCINE  05/06/2017  . MAMMOGRAM  03/13/2018  . TETANUS/TDAP  09/11/2021     Discussed health benefits of physical activity, and encouraged her to engage in regular exercise appropriate for her age and condition.    --------------------------------------------------------------------  1. Annual physical exam Encourage increasing exercise and reducing calorie intake to loose weight.   2. Borderline diabetes   3. Iron deficiency  - Ferritin  4. B12 deficiency  - Vitamin B12  5. Muscle spasm   6. Myalgia  - CK (Creatine Kinase) - Sed Rate (ESR)  7. Need for influenza vaccination  - Flu Vaccine QUAD 36+ mos IM   Lelon Huh, MD  [facility]

## 2017-09-22 ENCOUNTER — Telehealth: Payer: Self-pay

## 2017-09-22 NOTE — Telephone Encounter (Signed)
-----   Message from [name], MD sent at 09/22/2017  7:48 AM EST ----- Iron levels are slightly low, b12 levels are normal. She should be on daily iron supplement. no sign of any type of inflammatory muscle diseases. She probably has some fibromyalgia. Best thing is to gradually increase exercise to 30 minutes every day or 60 minute every other day.

## 2017-09-22 NOTE — Telephone Encounter (Signed)
Patient advised as below. Patient verbalizes understanding and is in agreement with treatment plan.  

## 2017-09-24 LAB — VITAMIN B12: VITAMIN B 12: 602 pg/mL (ref 200–1100)

## 2017-09-24 LAB — TEST AUTHORIZATION

## 2017-09-24 LAB — CK: Total CK: 105 U/L (ref 29–143)

## 2017-09-24 LAB — SEDIMENTATION RATE: Sed Rate: 22 mm/h — ABNORMAL HIGH (ref 0–20)

## 2017-09-24 LAB — HEMOGLOBIN A1C W/OUT EAG: Hgb A1c MFr Bld: 5.8 % of total Hgb — ABNORMAL HIGH (ref <5.7)

## 2017-09-24 LAB — FERRITIN: Ferritin: 23 ng/mL (ref 10–232)

## 2017-11-23 ENCOUNTER — Encounter: Payer: Self-pay | Admitting: Family Medicine

## 2017-11-23 ENCOUNTER — Ambulatory Visit: Payer: Managed Care, Other (non HMO) | Admitting: Family Medicine

## 2017-11-23 VITALS — BP 110/78 | HR 68 | Temp 98.6°F | Resp 16 | Wt 243.0 lb

## 2017-11-23 DIAGNOSIS — M549 Dorsalgia, unspecified: Secondary | ICD-10-CM

## 2017-11-23 DIAGNOSIS — K529 Noninfective gastroenteritis and colitis, unspecified: Secondary | ICD-10-CM | POA: Diagnosis not present

## 2017-11-23 DIAGNOSIS — R1084 Generalized abdominal pain: Secondary | ICD-10-CM

## 2017-11-23 LAB — POCT URINALYSIS DIPSTICK
GLUCOSE UA: NEGATIVE
KETONES UA: NEGATIVE
Nitrite, UA: NEGATIVE
Odor: NORMAL
Spec Grav, UA: 1.025 (ref 1.010–1.025)
Urobilinogen, UA: 0.2 E.U./dL
pH, UA: 6 (ref 5.0–8.0)

## 2017-11-23 MED ORDER — ONDANSETRON HCL 4 MG PO TABS: 4.0000 mg | ORAL_TABLET | Freq: Three times a day (TID) | ORAL | 0 refills | Status: AC | PRN

## 2017-11-23 NOTE — Progress Notes (Signed)
Patient: Heather Bauer Female    DOB: 05-06-1966   52 y.o.   MRN: [id number] Visit Date: 11/23/2017  Today's Provider: Lelon Huh, MD   Chief Complaint  Patient presents with  . Nausea   Subjective:    HPI Nausea and vomiting: Patient presents today reporting that she has been nauseous and had a few episodes of vomiting for the past 3 days. Other associated symptoms include weakness, feeling jittery, fever (up to 101 on Friday), body aches, abdominal pain, headache, chills and diarrhea. Patient states her sister gave her a Zofran and symptoms have improved.   Last emesis was 2 days ago. No fever for two days. Nwo having persistent mid back pain. Having poor appetite, but nausea is better. Diarrhea has resolved.     Allergies  Allergen Reactions  . Methocarbamol Hives  . Sulfa Antibiotics     Fever  . Tape Other (See Comments)    Latex tape     Current Outpatient Medications:  .  propranolol (INDERAL) 40 MG tablet, TAKE 1 TABLET BY MOUTH EVERY 12 HOURS, Disp: 60 tablet, Rfl: 2 .  loratadine (CLARITIN) 10 MG tablet, Take 1 tablet by mouth daily as needed. , Disp: , Rfl:  .  Multiple Vitamins-Minerals (MULTIVITAMIN GUMMIES WOMENS PO), Take 2 Doses by mouth daily., Disp: , Rfl:  .  vitamin B-12 (CYANOCOBALAMIN) 100 MCG tablet, Take 100 mcg by mouth daily., Disp: , Rfl:   Review of Systems  Constitutional: Positive for chills, fatigue and fever. Negative for appetite change.  Respiratory: Negative for chest tightness and shortness of breath.   Cardiovascular: Negative for chest pain and palpitations.  Gastrointestinal: Positive for abdominal pain, diarrhea, nausea and vomiting.  Musculoskeletal: Positive for myalgias.  Neurological: Positive for weakness, light-headedness and headaches. Negative for dizziness.    Social History   Tobacco Use  . Smoking status: Former Smoker    Packs/day: 0.50    Years: 7.00    Pack years: 3.50    Types: Cigarettes    Last  attempt to quit: 10/06/1993    Years since quitting: 24.1  . Smokeless tobacco: Never Used  Substance Use Topics  . Alcohol use: Yes    Alcohol/week: 0.0 oz    Comment: occasionally   Objective:   BP 110/78 (BP Location: Right Arm, Patient Position: Sitting, Cuff Size: Large)   Pulse 68   Temp 98.6 F (37 C) (Oral)   Resp 16   Wt 243 lb (110.2 kg)   SpO2 97% Comment: room air  BMI 38.06 kg/m     Physical Exam  General Appearance:    Alert, cooperative, no distress  Eyes:    PERRL, conjunctiva/corneas clear, EOM's intact       Lungs:     Clear to auscultation bilaterally, respirations unlabored  Heart:    Regular rate and rhythm  Abdomen:   bowel sounds present and normal in all 4 quadrants, soft, round or nontender. CVA tenderness is present bilaterally     Results for orders placed or performed in visit on 11/23/17  POCT Urinalysis Dipstick  Result Value Ref Range   Color, UA dark amber    Clarity, UA clear    Glucose, UA negative    Bilirubin, UA small    Ketones, UA negative    Spec Grav, UA 1.025 1.010 - 1.025   Blood, UA Trace (non-hemolyzed)    pH, UA 6.0 5.0 - 8.0   Protein, UA Trace  Urobilinogen, UA 0.2 0.2 or 1.0 E.U./dL   Nitrite, UA negative    Leukocytes, UA Trace (A) Negative   Appearance clear    Odor normal        Assessment & Plan:     1. Gastroenteritis Resolving. Counseled on bland diet. Prescription for ondansetron to take prn.   2. Acute bilateral back pain, unspecified back location No sign of renal involvement, likely part of systemic inflammatory response to viral GE.  - POCT Urinalysis Dipstick  3. Generalized abdominal pain  - POCT Urinalysis Dipstick  Call if symptoms change or if not rapidly improving.          Lelon Huh, MD  [facility]

## 2017-11-25 ENCOUNTER — Other Ambulatory Visit: Payer: Self-pay | Admitting: Family Medicine

## 2017-11-25 NOTE — Telephone Encounter (Signed)
Pharmacy requesting refills. Thanks!  

## 2018-05-25 ENCOUNTER — Other Ambulatory Visit: Payer: Self-pay | Admitting: Family Medicine

## 2019-02-26 ENCOUNTER — Emergency Department: Payer: Managed Care, Other (non HMO)

## 2019-02-26 ENCOUNTER — Observation Stay
Admission: EM | Admit: 2019-02-26 | Discharge: 2019-02-28 | Disposition: A | Discharge status: Home / Self Care | Payer: Managed Care, Other (non HMO) | Attending: Internal Medicine | Admitting: Internal Medicine

## 2019-02-26 ENCOUNTER — Encounter: Payer: Self-pay | Admitting: Emergency Medicine

## 2019-02-26 ENCOUNTER — Other Ambulatory Visit: Payer: Self-pay

## 2019-02-26 ENCOUNTER — Ambulatory Visit
Admission: EM | Admit: 2019-02-26 | Discharge: 2019-02-26 | Disposition: A | Discharge status: Nursing Home (Includes ICF) | Payer: Managed Care, Other (non HMO) | Source: Home / Self Care

## 2019-02-26 DIAGNOSIS — J45909 Unspecified asthma, uncomplicated: Secondary | ICD-10-CM | POA: Insufficient documentation

## 2019-02-26 DIAGNOSIS — R51 Headache: Secondary | ICD-10-CM | POA: Diagnosis not present

## 2019-02-26 DIAGNOSIS — Z79899 Other long term (current) drug therapy: Secondary | ICD-10-CM | POA: Insufficient documentation

## 2019-02-26 DIAGNOSIS — Z1159 Encounter for screening for other viral diseases: Secondary | ICD-10-CM | POA: Insufficient documentation

## 2019-02-26 DIAGNOSIS — J029 Acute pharyngitis, unspecified: Secondary | ICD-10-CM

## 2019-02-26 DIAGNOSIS — Z87891 Personal history of nicotine dependence: Secondary | ICD-10-CM | POA: Diagnosis not present

## 2019-02-26 DIAGNOSIS — J392 Other diseases of pharynx: Secondary | ICD-10-CM | POA: Diagnosis not present

## 2019-02-26 DIAGNOSIS — R7303 Prediabetes: Secondary | ICD-10-CM | POA: Diagnosis not present

## 2019-02-26 DIAGNOSIS — Z8249 Family history of ischemic heart disease and other diseases of the circulatory system: Secondary | ICD-10-CM | POA: Diagnosis not present

## 2019-02-26 DIAGNOSIS — T380X5A Adverse effect of glucocorticoids and synthetic analogues, initial encounter: Secondary | ICD-10-CM | POA: Insufficient documentation

## 2019-02-26 DIAGNOSIS — Z85828 Personal history of other malignant neoplasm of skin: Secondary | ICD-10-CM | POA: Diagnosis not present

## 2019-02-26 DIAGNOSIS — E538 Deficiency of other specified B group vitamins: Secondary | ICD-10-CM | POA: Insufficient documentation

## 2019-02-26 DIAGNOSIS — Z791 Long term (current) use of non-steroidal anti-inflammatories (NSAID): Secondary | ICD-10-CM | POA: Diagnosis not present

## 2019-02-26 DIAGNOSIS — K219 Gastro-esophageal reflux disease without esophagitis: Secondary | ICD-10-CM | POA: Diagnosis not present

## 2019-02-26 DIAGNOSIS — M542 Cervicalgia: Secondary | ICD-10-CM | POA: Diagnosis present

## 2019-02-26 DIAGNOSIS — I10 Essential (primary) hypertension: Secondary | ICD-10-CM | POA: Diagnosis not present

## 2019-02-26 DIAGNOSIS — R519 Headache, unspecified: Secondary | ICD-10-CM

## 2019-02-26 LAB — CBC WITH DIFFERENTIAL/PLATELET
Abs Immature Granulocytes: 0.03 10*3/uL (ref 0.00–0.07)
Basophils Absolute: 0 10*3/uL (ref 0.0–0.1)
Basophils Relative: 0 %
Eosinophils Absolute: 0.3 10*3/uL (ref 0.0–0.5)
Eosinophils Relative: 3 %
HCT: 36.5 % (ref 36.0–46.0)
Hemoglobin: 11.8 g/dL — ABNORMAL LOW (ref 12.0–15.0)
Immature Granulocytes: 0 %
Lymphocytes Relative: 23 %
Lymphs Abs: 2.1 10*3/uL (ref 0.7–4.0)
MCH: 27.3 pg (ref 26.0–34.0)
MCHC: 32.3 g/dL (ref 30.0–36.0)
MCV: 84.3 fL (ref 80.0–100.0)
Monocytes Absolute: 0.5 10*3/uL (ref 0.1–1.0)
Monocytes Relative: 5 %
Neutro Abs: 6.2 10*3/uL (ref 1.7–7.7)
Neutrophils Relative %: 69 %
Platelets: 264 10*3/uL (ref 150–400)
RBC: 4.33 MIL/uL (ref 3.87–5.11)
RDW: 14.1 % (ref 11.5–15.5)
WBC: 9.1 10*3/uL (ref 4.0–10.5)
nRBC: 0 % (ref 0.0–0.2)

## 2019-02-26 LAB — COMPREHENSIVE METABOLIC PANEL
ALT: 14 U/L (ref 0–44)
AST: 16 U/L (ref 15–41)
Albumin: 4.3 g/dL (ref 3.5–5.0)
Alkaline Phosphatase: 71 U/L (ref 38–126)
Anion gap: 9 (ref 5–15)
BUN: 11 mg/dL (ref 6–20)
CO2: 27 mmol/L (ref 22–32)
Calcium: 8.8 mg/dL — ABNORMAL LOW (ref 8.9–10.3)
Chloride: 103 mmol/L (ref 98–111)
Creatinine, Ser: 0.58 mg/dL (ref 0.44–1.00)
GFR calc Af Amer: >60 mL/min (ref >60)
GFR calc non Af Amer: >60 mL/min (ref >60)
Glucose, Bld: 164 mg/dL — ABNORMAL HIGH (ref 70–99)
Potassium: 4.1 mmol/L (ref 3.5–5.1)
Sodium: 139 mmol/L (ref 135–145)
Total Bilirubin: 0.6 mg/dL (ref 0.3–1.2)
Total Protein: 7.4 g/dL (ref 6.5–8.1)

## 2019-02-26 LAB — URINALYSIS, COMPLETE (UACMP) WITH MICROSCOPIC
Bacteria, UA: NONE SEEN
Bilirubin Urine: NEGATIVE
Glucose, UA: NEGATIVE mg/dL
Hgb urine dipstick: NEGATIVE
Ketones, ur: NEGATIVE mg/dL
Leukocytes,Ua: NEGATIVE
Nitrite: NEGATIVE
Protein, ur: NEGATIVE mg/dL
Specific Gravity, Urine: 1.019 (ref 1.005–1.030)
pH: 6 (ref 5.0–8.0)

## 2019-02-26 LAB — RAPID STREP SCREEN (MED CTR MEBANE ONLY): Streptococcus, Group A Screen (Direct): NEGATIVE

## 2019-02-26 LAB — SARS CORONAVIRUS 2 BY RT PCR (HOSPITAL ORDER, PERFORMED IN ~~LOC~~ HOSPITAL LAB): SARS Coronavirus 2: NEGATIVE

## 2019-02-26 LAB — POCT PREGNANCY, URINE: Preg Test, Ur: NEGATIVE

## 2019-02-26 LAB — LACTIC ACID, PLASMA: Lactic Acid, Venous: 1.6 mmol/L (ref 0.5–1.9)

## 2019-02-26 MED ORDER — IOHEXOL 300 MG/ML  SOLN
75.0000 mL | Freq: Once | INTRAMUSCULAR | Status: AC | PRN
  Administered 2019-02-26: 18:00:00 75 mL via INTRAVENOUS

## 2019-02-26 MED ORDER — LORATADINE 10 MG PO TABS: 10.0000 mg | ORAL_TABLET | Freq: Every day | ORAL | Status: DC | PRN

## 2019-02-26 MED ORDER — METHYLPREDNISOLONE SODIUM SUCC 125 MG IJ SOLR
125.0000 mg | Freq: Once | INTRAMUSCULAR | Status: AC
  Administered 2019-02-26: 19:00:00 125 mg via INTRAVENOUS
  Filled 2019-02-26: qty 2

## 2019-02-26 MED ORDER — ONDANSETRON HCL 4 MG/2ML IJ SOLN: 4.0000 mg | Freq: Four times a day (QID) | INTRAMUSCULAR | Status: DC | PRN

## 2019-02-26 MED ORDER — ADULT MULTIVITAMIN W/MINERALS CH
1.0000 | ORAL_TABLET | Freq: Every day | ORAL | Status: DC
  Administered 2019-02-27 – 2019-02-28 (×2): 1 via ORAL
  Filled 2019-02-26 (×2): qty 1

## 2019-02-26 MED ORDER — PREDNISONE 20 MG PO TABS: 40.0000 mg | ORAL_TABLET | Freq: Every day | ORAL | Status: DC

## 2019-02-26 MED ORDER — ACETAMINOPHEN 325 MG PO TABS: 650.0000 mg | ORAL_TABLET | Freq: Four times a day (QID) | ORAL | Status: DC | PRN

## 2019-02-26 MED ORDER — SODIUM CHLORIDE 0.9 % IV SOLN
3.0000 g | Freq: Once | INTRAVENOUS | Status: AC
  Administered 2019-02-26: 19:00:00 3 g via INTRAVENOUS
  Filled 2019-02-26: qty 3

## 2019-02-26 MED ORDER — SODIUM CHLORIDE 0.9 % IV SOLN
3.0000 g | Freq: Four times a day (QID) | INTRAVENOUS | Status: DC
  Administered 2019-02-27 – 2019-02-28 (×6): 3 g via INTRAVENOUS
  Filled 2019-02-26 (×9): qty 3

## 2019-02-26 MED ORDER — PANTOPRAZOLE SODIUM 40 MG PO TBEC
40.0000 mg | DELAYED_RELEASE_TABLET | Freq: Every day | ORAL | Status: DC
  Administered 2019-02-27 – 2019-02-28 (×2): 40 mg via ORAL
  Filled 2019-02-26 (×2): qty 1

## 2019-02-26 MED ORDER — ENOXAPARIN SODIUM 40 MG/0.4ML ~~LOC~~ SOLN
40.0000 mg | SUBCUTANEOUS | Status: DC
  Administered 2019-02-26 – 2019-02-27 (×2): 40 mg via SUBCUTANEOUS
  Filled 2019-02-26 (×2): qty 0.4

## 2019-02-26 MED ORDER — CYCLOBENZAPRINE HCL 10 MG PO TABS: 10.0000 mg | ORAL_TABLET | Freq: Three times a day (TID) | ORAL | Status: DC | PRN

## 2019-02-26 MED ORDER — ONDANSETRON HCL 4 MG PO TABS: 4.0000 mg | ORAL_TABLET | Freq: Four times a day (QID) | ORAL | Status: DC | PRN

## 2019-02-26 MED ORDER — PROPRANOLOL HCL 40 MG PO TABS
40.0000 mg | ORAL_TABLET | Freq: Two times a day (BID) | ORAL | Status: DC
  Administered 2019-02-26 – 2019-02-28 (×4): 40 mg via ORAL
  Filled 2019-02-26 (×5): qty 1

## 2019-02-26 MED ORDER — KETOROLAC TROMETHAMINE 30 MG/ML IJ SOLN
30.0000 mg | Freq: Four times a day (QID) | INTRAMUSCULAR | Status: DC | PRN
  Administered 2019-02-26 – 2019-02-27 (×2): 30 mg via INTRAVENOUS
  Filled 2019-02-26 (×2): qty 1

## 2019-02-26 MED ORDER — ACETAMINOPHEN 650 MG RE SUPP: 650.0000 mg | Freq: Four times a day (QID) | RECTAL | Status: DC | PRN

## 2019-02-26 MED ORDER — LIDOCAINE VISCOUS HCL 2 % MT SOLN
15.0000 mL | Freq: Once | OROMUCOSAL | Status: AC
  Administered 2019-02-26: 18:00:00 15 mL via OROMUCOSAL
  Filled 2019-02-26: qty 15

## 2019-02-26 NOTE — ED Provider Notes (Signed)
[facility] Emergency Department Provider Note   ____________________________________________    I have reviewed the triage vital signs and the nursing notes.   HISTORY  Chief Complaint Neck Pain; Sore Throat; and Fever     HPI Heather Bauer is a 53 y.o. female who presents with complaints of sore throat, neck pain and fever.  Patient reports over the last 3 days she has had worsening pain in her bilateral neck as well as painful swallowing.  She reports it is worse when she tilts her chin towards her chest.  Reports fever of 101 this morning.  No exposure to COVID-19 patients that she knows of.  No recent travel.  No history of similar symptoms.  No difficulty breathing.  Past Medical History:  Diagnosis Date  . Asthma   . Cancer ([place])    skin cancer, basal cell  . GERD (gastroesophageal reflux disease)   . HTN (hypertension)     Patient Active Problem List   Diagnosis Date Noted  . B12 deficiency 09/15/2016  . Wheezing 09/15/2016  . Muscle spasm 06/20/2016  . Restless leg syndrome 06/11/2016  . Abdominal bloating 03/06/2016  . Adnexal pain 09/14/2015  . Iron deficiency 09/14/2015  . Fasciculations 09/14/2015  . Acne vulgaris 03/08/2015  . Basal cell carcinoma of skin 03/08/2015  . Back pain, thoracic 03/08/2015  . Chronic gastritis 03/08/2015  . Blood pressure elevated 03/08/2015  . Esophageal reflux 03/08/2015  . Cephalalgia 03/08/2015  . Urticaria 03/08/2015  . Adult BMI 30+ 03/08/2015  . Polycystic ovaries 03/08/2015  . Borderline diabetes 03/08/2015    Past Surgical History:  Procedure Laterality Date  . ADENOIDECTOMY  01/2001  . BASAL CELL CARCINOMA EXCISION  01/06/2008  . COLONOSCOPY WITH PROPOFOL N/A 11/24/2016   Procedure: COLONOSCOPY WITH PROPOFOL;  Surgeon: Lollie Sails, MD;  Location: [facility] ENDOSCOPY;  Service: Endoscopy;  Laterality: N/A;  . POLYPECTOMY     benign  . TONSILLECTOMY  1980    Prior to Admission  medications   Medication Sig Start Date End Date Taking? Authorizing Provider  cyclobenzaprine (FLEXERIL) 10 MG tablet Take 10 mg by mouth 3 (three) times daily as needed.  02/24/19  Yes [provider]  loratadine (CLARITIN) 10 MG tablet Take 1 tablet by mouth daily as needed.  09/12/11  Yes [provider]  Multiple Vitamins-Minerals (MULTIVITAMIN GUMMIES WOMENS PO) Take 2 Doses by mouth daily.   Yes [provider]  NAPROXEN DR 500 MG EC tablet  02/24/19  Yes [provider]  pantoprazole (PROTONIX) 40 MG tablet Take 40 mg by mouth daily.  02/24/19  Yes [provider]  propranolol (INDERAL) 40 MG tablet TAKE 1 TABLET BY MOUTH EVERY 12 HOURS 05/25/18  Yes [name], MD     Allergies Latex; Methocarbamol; Sulfa antibiotics; and Tape  Family History  Problem Relation Age of Onset  . Hypertension Mother   . Uterine cancer Mother   . Graves' disease Mother   . Hyperlipidemia Father   . Hypertension Father   . COPD Father   . Fibrocystic breast disease Sister   . Colon cancer Maternal Aunt   . Diabetes Maternal Grandmother   . Heart attack Maternal Grandmother   . Hypertension Maternal Grandfather   . Heart disease Maternal Grandfather   . Heart attack Maternal Grandfather     Social History Social History   Tobacco Use  . Smoking status: Former Smoker    Packs/day: 0.50  Years: 7.00    Pack years: 3.50    Types: Cigarettes    Last attempt to quit: 10/06/1993    Years since quitting: 25.4  . Smokeless tobacco: Never Used  Substance Use Topics  . Alcohol use: Yes    Alcohol/week: 0.0 standard drinks    Comment: occasionally  . Drug use: No    Review of Systems  Constitutional: As above Eyes: No visual changes.  ENT: As above Cardiovascular: Denies chest pain. Respiratory: Denies shortness of breath. Gastrointestinal: No abdominal pain.  No nausea, no vomiting.   Genitourinary: Negative for dysuria. Musculoskeletal:  Negative for back pain. Skin: Negative for rash. Neurological: Negative for headaches or weakness   ____________________________________________   PHYSICAL EXAM:  VITAL SIGNS: ED Triage Vitals  Enc Vitals Group     BP 02/26/19 1522 (!) 149/81     Pulse Rate 02/26/19 1522 74     Resp 02/26/19 1522 18     Temp 02/26/19 1522 98.2 F (36.8 C)     Temp Source 02/26/19 1522 Oral     SpO2 02/26/19 1522 99 %     Weight --      Height --      Head Circumference --      Peak Flow --      Pain Score 02/26/19 1520 3     Pain Loc --      Pain Edu? --      Excl. in [place]? --     Constitutional: Alert and oriented.  Eyes: Conjunctivae are normal.  Head: Atraumatic. Nose: No congestion/rhinnorhea. Mouth/Throat: Pharynx is normal, mucous membranes are moist.  Positive mild lymphadenopathy.  No swelling of the floor of the mouth but no induration.  Normal tongue Neck: Tenderness over bilateral trapezius insertion sites, mild no erythema.  No stridor Cardiovascular: Normal rate, regular rhythm.  Good peripheral circulation. Respiratory: Normal respiratory effort.  No retractions.  Gastrointestinal: Soft and nontender. No distention.  No CVA tenderness.  Musculoskeletal: .  Warm and well perfused Neurologic:  Normal speech and language. No gross focal neurologic deficits are appreciated.  Skin:  Skin is warm, dry and intact. No rash noted. Psychiatric: Mood and affect are normal. Speech and behavior are normal.  ____________________________________________   LABS (all labs ordered are listed, but only abnormal results are displayed)  Labs Reviewed  COMPREHENSIVE METABOLIC PANEL - Abnormal; Notable for the following components:      Result Value   Glucose, Bld 164 (*)    Calcium 8.8 (*)    All other components within normal limits  CBC WITH DIFFERENTIAL/PLATELET - Abnormal; Notable for the following components:   Hemoglobin 11.8 (*)    All other components within normal limits   URINALYSIS, COMPLETE (UACMP) WITH MICROSCOPIC - Abnormal; Notable for the following components:   Color, Urine YELLOW (*)    APPearance HAZY (*)    All other components within normal limits  SARS CORONAVIRUS 2 (HOSPITAL ORDER, [place] [facility])  LACTIC ACID, PLASMA  POCT PREGNANCY, URINE  POC URINE PREG, ED   ____________________________________________  EKG  None ____________________________________________  RADIOLOGY  CT neck demonstrates retropharyngeal edema ____________________________________________   PROCEDURES  Procedure(s) performed: No  Procedures   Critical Care performed: No ____________________________________________   INITIAL IMPRESSION / ASSESSMENT AND PLAN / ED COURSE  Pertinent labs & imaging results that were available during my care of the patient were reviewed by me and considered in my medical decision making (see chart for details).  Patient presents with neck pain, painful swallowing, fever as described above.  Suspicious for deep space neck infection, strep swab negative urgent care.  Also possible COVID-19 manifestation with lymphadenopathy causing her discomfort.  Lab work overall reassuring, will obtain CT neck and send COVID swab   Discussed CT results with Dr. Maylene Roes of ENT, concerned that retropharyngeal edema could represent early infection/developing abscess.  Will start the patient on Unasyn, steroids and admit to the hospitalist service    Heather Bauer was evaluated in Emergency Department on 02/26/2019 for the symptoms described in the history of present illness. She was evaluated in the context of the global COVID-19 pandemic, which necessitated consideration that the patient might be at risk for infection with the SARS-CoV-2 virus that causes COVID-19. Institutional protocols and algorithms that pertain to the evaluation of patients at risk for COVID-19 are in a state of rapid change based on information released  by regulatory bodies including the CDC and federal and state organizations. These policies and algorithms were followed during the patient's care in the ED.    ____________________________________________   FINAL CLINICAL IMPRESSION(S) / ED DIAGNOSES  Final diagnoses:  Edema of throat        Note:  This document was prepared using Dragon voice recognition software and may include unintentional dictation errors.   Lavonia Drafts, MD 02/26/19 [id number]

## 2019-02-26 NOTE — Discharge Instructions (Addendum)
Go directly to emergency room.  

## 2019-02-26 NOTE — Progress Notes (Signed)
Pharmacy Antibiotic Note  Heather Bauer is a 53 y.o. female admitted on 02/26/2019 with possible retropharyngeal abscess.  Pharmacy has been consulted for Unasyn dosing.  Plan: Unasyn 3g IV q6h     Temp (24hrs), Avg:98.2 F (36.8 C), Min:98.2 F (36.8 C), Max:98.2 F (36.8 C)  Recent Labs  Lab 02/26/19 1530 02/26/19 1532  WBC  --  9.1  CREATININE  --  0.58  LATICACIDVEN 1.6  --     Estimated Creatinine Clearance: 102.5 mL/min (by C-G formula based on SCr of 0.58 mg/dL).    Allergies  Allergen Reactions  . Latex Itching    Latex gloves, bandaids, tape  . Methocarbamol Hives  . Sulfa Antibiotics     Fever  . Tape Other (See Comments)    Latex tape    Antimicrobials this admission: Unasyn 5/23 >>   Thank you for allowing pharmacy to be a part of this patient's care.  Paulina Fusi, PharmD, BCPS 02/26/2019 7:10 PM

## 2019-02-26 NOTE — ED Provider Notes (Signed)
[facility] ____________________________________________  Time seen: Approximately 2:30 PM  I have reviewed the triage vital signs and the nursing notes.   HISTORY  Chief Complaint Sore Throat   HPI Heather Bauer is a 53 y.o. female presenting for evaluation of neck pain.  Patient reports Tuesday she noticed left-sided posterior neck pain that has continued to worsen.  States Wednesday into Thursday she noticed pain on both sides of her neck.  Was seen by her primary care office on Thursday and was treated for muscular pain with naproxen and Flexeril, in which patient states these medications have not helped pain, and pain has continued to worsen.  Patient states neck pain has been moderate to severe.  Pain is worse with movement and she has to guard her head movement.  States she is not able to fully move her head and neck as normal.  States in the last 2 days she is also developed accompanying pain to her throat and painful swallowing as well.  Also reports the last 2 days has had low-grade fevers of T-max 100.  Positive accompanying headache in which she states she does not normally have headaches and would describe current headache is worse headache in life.  No abrupt onset.  States that she has been taking naproxen and Flexeril regularly without change.  Denies cough, chest pain, shortness of breath, nasal congestion.  Denies vision changes or photophobia.  No rash.  Denies injury.  Denies other recent changes.  Denies history of similar.  Patient's last menstrual period was 02/19/2019 (exact date).  [name], MD: PCP   Past Medical History:  Diagnosis Date  . Asthma   . Cancer ([place])    skin cancer, basal cell  . GERD (gastroesophageal reflux disease)   . HTN (hypertension)     Patient Active Problem List   Diagnosis Date Noted  . B12 deficiency 09/15/2016  . Wheezing 09/15/2016  . Muscle spasm 06/20/2016  . Restless leg syndrome 06/11/2016  . Abdominal  bloating 03/06/2016  . Adnexal pain 09/14/2015  . Iron deficiency 09/14/2015  . Fasciculations 09/14/2015  . Acne vulgaris 03/08/2015  . Basal cell carcinoma of skin 03/08/2015  . Back pain, thoracic 03/08/2015  . Chronic gastritis 03/08/2015  . Blood pressure elevated 03/08/2015  . Esophageal reflux 03/08/2015  . Cephalalgia 03/08/2015  . Urticaria 03/08/2015  . Adult BMI 30+ 03/08/2015  . Polycystic ovaries 03/08/2015  . Borderline diabetes 03/08/2015    Past Surgical History:  Procedure Laterality Date  . ADENOIDECTOMY  01/2001  . BASAL CELL CARCINOMA EXCISION  01/06/2008  . COLONOSCOPY WITH PROPOFOL N/A 11/24/2016   Procedure: COLONOSCOPY WITH PROPOFOL;  Surgeon: Lollie Sails, MD;  Location: [facility] ENDOSCOPY;  Service: Endoscopy;  Laterality: N/A;  . POLYPECTOMY     benign  . TONSILLECTOMY  1980     No current facility-administered medications for this encounter.   Current Outpatient Medications:  .  cyclobenzaprine (FLEXERIL) 10 MG tablet, , Disp: , Rfl:  .  loratadine (CLARITIN) 10 MG tablet, Take 1 tablet by mouth daily as needed. , Disp: , Rfl:  .  Multiple Vitamins-Minerals (MULTIVITAMIN GUMMIES WOMENS PO), Take 2 Doses by mouth daily., Disp: , Rfl:  .  NAPROXEN DR 500 MG EC tablet, , Disp: , Rfl:  .  pantoprazole (PROTONIX) 40 MG tablet, , Disp: , Rfl:  .  propranolol (INDERAL) 40 MG tablet, TAKE 1 TABLET BY MOUTH EVERY 12 HOURS, Disp: 60 tablet, Rfl: 11 .  vitamin  B-12 (CYANOCOBALAMIN) 100 MCG tablet, Take 100 mcg by mouth daily., Disp: , Rfl:   Allergies Methocarbamol; Sulfa antibiotics; and Tape  Family History  Problem Relation Age of Onset  . Hypertension Mother   . Uterine cancer Mother   . Graves' disease Mother   . Hyperlipidemia Father   . Hypertension Father   . COPD Father   . Fibrocystic breast disease Sister   . Colon cancer Maternal Aunt   . Diabetes Maternal Grandmother   . Heart attack Maternal Grandmother   . Hypertension Maternal  Grandfather   . Heart disease Maternal Grandfather   . Heart attack Maternal Grandfather     Social History Social History   Tobacco Use  . Smoking status: Former Smoker    Packs/day: 0.50    Years: 7.00    Pack years: 3.50    Types: Cigarettes    Last attempt to quit: 10/06/1993    Years since quitting: 25.4  . Smokeless tobacco: Never Used  Substance Use Topics  . Alcohol use: Yes    Alcohol/week: 0.0 standard drinks    Comment: occasionally  . Drug use: No    Review of Systems Constitutional: Positive fever. Eyes: No visual changes. ENT: Positive sore throat Cardiovascular: Denies chest pain. Respiratory: Denies shortness of breath. Gastrointestinal: No abdominal pain.  No nausea, no vomiting. Musculoskeletal: Positive neck pain. Skin: Negative for rash. Neurological: Positive for headache.  Negative for focal weakness or numbness.    ____________________________________________   PHYSICAL EXAM:  VITAL SIGNS: ED Triage Vitals  Enc Vitals Group     BP 02/26/19 1251 (!) 145/97     Pulse Rate 02/26/19 1251 65     Resp 02/26/19 1251 18     Temp 02/26/19 1251 98.2 F (36.8 C)     Temp Source 02/26/19 1251 Oral     SpO2 02/26/19 1251 98 %     Weight 02/26/19 1254 222 lb (100.7 kg)     Height 02/26/19 1254 5\' 7"  (1.702 m)     Head Circumference --      Peak Flow --      Pain Score 02/26/19 1254 8     Pain Loc --      Pain Edu? --      Excl. in [place]? --     Constitutional: Alert and oriented. Well appearing and in no acute distress. Eyes: Conjunctivae are normal. PERRL. EOMI. no pain with EOMs. Head: Atraumatic. No sinus tenderness to palpation. No swelling. No erythema.  Ears: no erythema, normal TMs bilaterally.   Nose:No nasal congestion with clear rhinorrhea  Mouth/Throat: Mucous membranes are moist. No pharyngeal erythema. No tonsillar swelling or exudate.  Neck: No stridor.  Hematological/Lymphatic/Immunilogical: Minimal anterior bilateral cervical  lymphadenopathy. Cardiovascular: Normal rate, regular rhythm. Grossly normal heart sounds.  Good peripheral circulation. Respiratory: Normal respiratory effort.  No retractions. No wheezes, rales or rhonchi. Good air movement.  Musculoskeletal: Ambulatory with steady gait.  No midline thoracic or lumbar tenderness palpation. Mild midline cervical and paracervical tenderness to palpation.  Guarded neck movements.  Unable to put chin to chest or fully rotate neck.  Positive tenderness to bilateral proximal trapezius mildly.  No rash. Neurologic:  Normal speech and language. No gait instability.  No paresthesias. Skin:  Skin appears warm, dry and intact. No rash noted. Psychiatric: Mood and affect are normal. Speech and behavior are normal. ___________________________________________   LABS (all labs ordered are listed, but only abnormal results are displayed)  Labs Reviewed  RAPID STREP SCREEN ([facility] ONLY)  CULTURE, GROUP A STREP [facility])     PROCEDURES Procedures    INITIAL IMPRESSION / ASSESSMENT AND PLAN / ED COURSE  Pertinent labs & imaging results that were available during my care of the patient were reviewed by me and considered in my medical decision making (see chart for details).  Overall well-appearing patient.  However patient reporting continued neck pain and headache that has not responded to naproxen and Flexeril.  No injury.  Patient also now reporting sore throat and low-grade fevers.  Discussed multiple differentials with patient including musculoskeletal pain, streptococcal pharyngitis, mononucleosis, viral illness, meningitis.  At this time as patient has continued to complain of worsening pain with outpatient medications and continued worsening complaints, recommend further evaluation emergency room at this time.  Patient agrees this plan.  Patient states her mother will take her to the emergency room. ____________________________________________   FINAL  CLINICAL IMPRESSION(S) / ED DIAGNOSES  Final diagnoses:  Neck pain  Acute intractable headache, unspecified headache type  Pharyngitis, unspecified etiology     ED Discharge Orders    None       Note: This dictation was prepared with Dragon dictation along with smaller phrase technology. Any transcriptional errors that result from this process are unintentional.         Marylene Land, NP 02/26/19 1441

## 2019-02-26 NOTE — ED Notes (Addendum)
ED TO INPATIENT HANDOFF REPORT  ED Nurse Name and Phone #: Karena Addison [phone]  S Name/Age/Gender Heather Bauer 53 y.o. female Room/Bed: ED11A/ED11A  Code Status   Code Status: Not on file  Home/SNF/Other Home Patient oriented to: self, place, time and situation Is this baseline? Yes   Triage Complete: Triage complete  Chief Complaint neck and headache  Triage Note Patient reports fever of 101 today, sore throat that is very painful when she swallows.  Patient reports neck pain.  Patient states she is being treated by her PCP for trapezius muscle spasms.  Patient states the medication seemed to help on Thursday but did not help pain last night.  Patient states she took naproxen at 11am.  Patient states she was checked for strep at [facility] urgent care prior to arrival.  Patient is unable to bend her chin to her chest at this time.     Allergies Allergies  Allergen Reactions  . Latex Itching    Latex gloves, bandaids, tape  . Methocarbamol Hives  . Sulfa Antibiotics     Fever  . Tape Other (See Comments)    Latex tape    Level of Care/Admitting Diagnosis ED Disposition    ED Disposition Condition [facility] Area: [place] [100120]  Level of Care: Med-Surg [16]  Covid Evaluation: Screening Protocol (No Symptoms)  Diagnosis: Neck pain [240973]  Admitting Physician: Henreitta Leber [[id number]]  Attending Physician: Henreitta Leber [[id number]]  PT Class (Do Not Modify): Observation [104]  PT Acc Code (Do Not Modify): Observation [10022]       B Medical/Surgery History Past Medical History:  Diagnosis Date  . Asthma   . Cancer ([place]    skin cancer, basal cell  . GERD (gastroesophageal reflux disease)   . HTN (hypertension)    Past Surgical History:  Procedure Laterality Date  . ADENOIDECTOMY  01/2001  . BASAL CELL CARCINOMA EXCISION  01/06/2008  . COLONOSCOPY WITH PROPOFOL N/A 11/24/2016   Procedure: COLONOSCOPY WITH PROPOFOL;   Surgeon: Lollie Sails, MD;  Location: [facility] ENDOSCOPY;  Service: Endoscopy;  Laterality: N/A;  . POLYPECTOMY     benign  . TONSILLECTOMY  1980     A IV Location/Drains/Wounds Patient Lines/Drains/Airways Status   Active Line/Drains/Airways    Name:   Placement date:   Placement time:   Site:   Days:   Peripheral IV 04/17/15 Right Antecubital   04/17/15    1459    Antecubital   1411   Peripheral IV 02/26/19 Left Antecubital   02/26/19    1534    Antecubital   less than 1          Intake/Output Last 24 hours No intake or output data in the 24 hours ending 02/26/19 1912  Labs/Imaging Results for orders placed or performed during the hospital encounter of 02/26/19 (from the past 48 hour(s))  Lactic acid, plasma     Status: None   Collection Time: 02/26/19  3:30 PM  Result Value Ref Range   Lactic Acid, Venous 1.6 0.5 - 1.9 mmol/L    Comment: Performed at [facility], [address] [place], [place] [address]  Comprehensive metabolic panel     Status: Abnormal   Collection Time: 02/26/19  3:32 PM  Result Value Ref Range   Sodium 139 135 - 145 mmol/L   Potassium 4.1 3.5 - 5.1 mmol/L   Chloride 103 98 - 111 mmol/L   CO2 27  22 - 32 mmol/L   Glucose, Bld 164 (H) 70 - 99 mg/dL   BUN 11 6 - 20 mg/dL   Creatinine, Ser 0.58 0.44 - 1.00 mg/dL   Calcium 8.8 (L) 8.9 - 10.3 mg/dL   Total Protein 7.4 6.5 - 8.1 g/dL   Albumin 4.3 3.5 - 5.0 g/dL   AST 16 15 - 41 U/L   ALT 14 0 - 44 U/L   Alkaline Phosphatase 71 38 - 126 U/L   Total Bilirubin 0.6 0.3 - 1.2 mg/dL   GFR calc non Af Amer >60 >60 mL/min   GFR calc Af Amer >60 >60 mL/min   Anion gap 9 5 - 15    Comment: Performed at [facility], [address] [place], [place] [address]  CBC with Differential     Status: Abnormal   Collection Time: 02/26/19  3:32 PM  Result Value Ref Range   WBC 9.1 4.0 - 10.5 K/uL   RBC 4.33 3.87 - 5.11 MIL/uL   Hemoglobin 11.8 (L) 12.0 - 15.0 g/dL   HCT 36.5 36.0 - 46.0 %    MCV 84.3 80.0 - 100.0 fL   MCH 27.3 26.0 - 34.0 pg   MCHC 32.3 30.0 - 36.0 g/dL   RDW 14.1 11.5 - 15.5 %   Platelets 264 150 - 400 K/uL   nRBC 0.0 0.0 - 0.2 %   Neutrophils Relative % 69 %   Neutro Abs 6.2 1.7 - 7.7 K/uL   Lymphocytes Relative 23 %   Lymphs Abs 2.1 0.7 - 4.0 K/uL   Monocytes Relative 5 %   Monocytes Absolute 0.5 0.1 - 1.0 K/uL   Eosinophils Relative 3 %   Eosinophils Absolute 0.3 0.0 - 0.5 K/uL   Basophils Relative 0 %   Basophils Absolute 0.0 0.0 - 0.1 K/uL   Immature Granulocytes 0 %   Abs Immature Granulocytes 0.03 0.00 - 0.07 K/uL    Comment: Performed at [facility], [address] [place], [place] [address]  Urinalysis, Complete w Microscopic     Status: Abnormal   Collection Time: 02/26/19  3:37 PM  Result Value Ref Range   Color, Urine YELLOW (A) YELLOW   APPearance HAZY (A) CLEAR   Specific Gravity, Urine 1.019 1.005 - 1.030   pH 6.0 5.0 - 8.0   Glucose, UA NEGATIVE NEGATIVE mg/dL   Hgb urine dipstick NEGATIVE NEGATIVE   Bilirubin Urine NEGATIVE NEGATIVE   Ketones, ur NEGATIVE NEGATIVE mg/dL   Protein, ur NEGATIVE NEGATIVE mg/dL   Nitrite NEGATIVE NEGATIVE   Leukocytes,Ua NEGATIVE NEGATIVE   RBC / HPF 0-5 0 - 5 RBC/hpf   WBC, UA 0-5 0 - 5 WBC/hpf   Bacteria, UA NONE SEEN NONE SEEN   Squamous Epithelial / LPF 0-5 0 - 5   Mucus PRESENT     Comment: Performed at [facility], [address] [place], [place] [address]  SARS Coronavirus 2 (CEPHEID- Performed in [place] hospital lab), Hosp Order     Status: None   Collection Time: 02/26/19  4:16 PM  Result Value Ref Range   SARS Coronavirus 2 NEGATIVE NEGATIVE    Comment: (NOTE) If result is NEGATIVE SARS-CoV-2 target nucleic acids are NOT DETECTED. The SARS-CoV-2 RNA is generally detectable in upper and lower  respiratory specimens during the acute phase of infection. The lowest  concentration of SARS-CoV-2 viral copies this assay can detect is 250  copies / mL. A  negative result does not  preclude SARS-CoV-2 infection  and should not be used as the sole basis for treatment or other  patient management decisions.  A negative result may occur with  improper specimen collection / handling, submission of specimen other  than nasopharyngeal swab, presence of viral mutation(s) within the  areas targeted by this assay, and inadequate number of viral copies  (<250 copies / mL). A negative result must be combined with clinical  observations, patient history, and epidemiological information. If result is POSITIVE SARS-CoV-2 target nucleic acids are DETECTED. The SARS-CoV-2 RNA is generally detectable in upper and lower  respiratory specimens dur ing the acute phase of infection.  Positive  results are indicative of active infection with SARS-CoV-2.  Clinical  correlation with patient history and other diagnostic information is  necessary to determine patient infection status.  Positive results do  not rule out bacterial infection or co-infection with other viruses. If result is PRESUMPTIVE POSTIVE SARS-CoV-2 nucleic acids MAY BE PRESENT.   A presumptive positive result was obtained on the submitted specimen  and confirmed on repeat testing.  While 2019 novel coronavirus  (SARS-CoV-2) nucleic acids may be present in the submitted sample  additional confirmatory testing may be necessary for epidemiological  and / or clinical management purposes  to differentiate between  SARS-CoV-2 and other Sarbecovirus currently known to infect humans.  If clinically indicated additional testing with an alternate test  methodology [phone]) is advised. The SARS-CoV-2 RNA is generally  detectable in upper and lower respiratory sp ecimens during the acute  phase of infection. The expected result is Negative. Fact Sheet for Patients:  StrictlyIdeas.no Fact Sheet for Healthcare Providers: BankingDealers.co.za This test is not  yet approved or cleared by the [place] FDA and has been authorized for detection and/or diagnosis of SARS-CoV-2 by FDA under an Emergency Use Authorization (EUA).  This EUA will remain in effect (meaning this test can be used) for the duration of the COVID-19 declaration under Section 564(b)(1) of the Act, 21 U.S.C. section 360bbb-3(b)(1), unless the authorization is terminated or revoked sooner. Performed at [facility], [address] [place], [place] [address]   Pregnancy, urine POC     Status: None   Collection Time: 02/26/19  5:37 PM  Result Value Ref Range   Preg Test, Ur NEGATIVE NEGATIVE    Comment:        THE SENSITIVITY OF THIS METHODOLOGY IS >24 mIU/mL    Ct Soft Tissue Neck W Contrast  Result Date: 02/26/2019 CLINICAL DATA:  Sore throat, neck pain, and fever. EXAM: CT NECK WITH CONTRAST TECHNIQUE: Multidetector CT imaging of the neck was performed using the standard protocol following the bolus administration of intravenous contrast. CONTRAST:  107mL OMNIPAQUE IOHEXOL 300 MG/ML  SOLN COMPARISON:  None. FINDINGS: Pharynx and larynx: Motion artifact is noted through the larynx and to a lesser extent pharynx. The pharyngeal soft tissues are symmetric without evidence of mass or definite swelling. There may be mild retropharyngeal edema, however no retropharyngeal or peritonsillar fluid collection is present. The airway is patent. Salivary glands: No inflammation, mass, or stone. Thyroid: Unremarkable. Lymph nodes: Scattered subcentimeter short axis lymph nodes in the neck bilaterally, likely reactive. Vascular: Major vascular structures of the neck are grossly patent. Multiple small venous collaterals are noted in the left supraclavicular region and anterior chest wall. Limited intracranial: Unremarkable. Visualized orbits: Unremarkable. Mastoids and visualized paranasal sinuses: Clear. Skeleton: No acute osseous abnormality or suspicious osseous lesion. Upper chest:  Clear lung apices. Other: None.  IMPRESSION: Possible mild retropharyngeal edema.  No abscess. Electronically Signed   By: Logan Bores M.D.   On: 02/26/2019 18:11    Pending Labs [employer] (From admission, onward)    Start     Ordered   Signed and Held  HIV antibody (Routine Testing)  Once,   R     Signed and Held   Signed and Occupational hygienist morning,   R     Signed and Held   Signed and Held  CBC  Tomorrow morning,   R     Signed and Held   Signed and Held  CBC  (enoxaparin (LOVENOX)    CrCl >/= 30 ml/min)  Once,   R    Comments:  Baseline for enoxaparin therapy IF NOT ALREADY DRAWN.  Notify MD if PLT < 100 K.    Signed and Held   Signed and Held  Creatinine, serum  (enoxaparin (LOVENOX)    CrCl >/= 30 ml/min)  Once,   R    Comments:  Baseline for enoxaparin therapy IF NOT ALREADY DRAWN.    Signed and Held   Signed and Held  Creatinine, serum  (enoxaparin (LOVENOX)    CrCl >/= 30 ml/min)  Weekly,   R    Comments:  while on enoxaparin therapy    Signed and Held          Vitals/Pain Today's Vitals   02/26/19 1630 02/26/19 1700 02/26/19 1730 02/26/19 1900  BP: (!) 156/93 (!) 141/88 (!) 152/94 (!) 152/86  Pulse: (!) 57 (!) 56 62 (!) 59  Resp:      Temp:      TempSrc:      SpO2: 99% 98% 100% 100%  PainSc:        Isolation Precautions No active isolations  Medications Medications  Ampicillin-Sulbactam (UNASYN) 3 g in sodium chloride 0.9 % 100 mL IVPB (3 g Intravenous New Bag/Given 02/26/19 1900)  ketorolac (TORADOL) 30 MG/ML injection 30 mg (has no administration in time range)  Ampicillin-Sulbactam (UNASYN) 3 g in sodium chloride 0.9 % 100 mL IVPB (has no administration in time range)  lidocaine (XYLOCAINE) 2 % viscous mouth solution 15 mL (15 mLs Mouth/Throat Given 02/26/19 1735)  iohexol (OMNIPAQUE) 300 MG/ML solution 75 mL (75 mLs Intravenous Contrast Given 02/26/19 1754)  methylPREDNISolone sodium succinate (SOLU-MEDROL) 125 mg/2 mL  injection 125 mg (125 mg Intravenous Given 02/26/19 1858)    Mobility walks Low fall risk   Focused Assessments    R Recommendations: See Admitting Provider Note  Report given to: Catalina Antigua, RN

## 2019-02-26 NOTE — ED Triage Notes (Signed)
Pt complains of neck pain since Tuesday and hurt when she moves her neck was seen by her pcp on Thursday and was told she was having neck spasms and developed sore throat on Wednesday with painful swallowing and swollen throat. Was given naproxen, pantoprazole and muscle relaxer.

## 2019-02-26 NOTE — ED Triage Notes (Addendum)
Patient reports fever of 101 today, sore throat that is very painful when she swallows.  Patient reports neck pain.  Patient states she is being treated by her PCP for trapezius muscle spasms.  Patient states the medication seemed to help on Thursday but did not help pain last night.  Patient states she took naproxen at 11am.  Patient states she was checked for strep at [facility] urgent care prior to arrival.  Patient is unable to bend her chin to her chest at this time.

## 2019-02-26 NOTE — H&P (Signed)
[place] NAME: Heather Bauer    MR#:  [id number]  DATE OF BIRTH:  09/24/1966  DATE OF ADMISSION:  02/26/2019  PRIMARY CARE PHYSICIAN: [name], MD   REQUESTING/REFERRING PHYSICIAN: Dr. Lavonia Drafts  CHIEF COMPLAINT:   Chief Complaint  Patient presents with   Neck Pain   Sore Throat   Fever    HISTORY OF PRESENT ILLNESS:  Heather Bauer  is a 53 y.o. female with a known history of GERD, hypertension, asthma, history of skin cancer who presents to the hospital due to neck pain and throat pain.  Patient says she recently developed the symptoms this past Tuesday with some neck pain which progressively got worse.  She went to see her primary care physician who thought it was more muscular and prescribed her some muscle relaxants and Aleve.  Despite taking those medications her symptoms have not improved and last night she had significant neck pain and now also has throat pain.  She has some pain with swallowing and also has had low-grade fevers over the past month and a half.  She has been tested for COVID 19 in March which was negative and her repeat COVID here in the ER today is also negative.  Patient underwent a CT of her neck which showed retropharyngeal edema.  The ER physician spoke to ENT about the CT scan findings and her symptoms and he suspected this could be a retropharyngeal abscess although CT does not show any fluid collection.  He recommended admission with trial of IV antibiotics and steroids.  Hospitalist services therefore contacted for admission.  PAST MEDICAL HISTORY:   Past Medical History:  Diagnosis Date   Asthma    Cancer ([place])    skin cancer, basal cell   GERD (gastroesophageal reflux disease)    HTN (hypertension)     PAST SURGICAL HISTORY:   Past Surgical History:  Procedure Laterality Date   ADENOIDECTOMY  01/2001   BASAL CELL CARCINOMA EXCISION  01/06/2008   COLONOSCOPY WITH PROPOFOL  N/A 11/24/2016   Procedure: COLONOSCOPY WITH PROPOFOL;  Surgeon: Lollie Sails, MD;  Location: [facility] ENDOSCOPY;  Service: Endoscopy;  Laterality: N/A;   POLYPECTOMY     benign   TONSILLECTOMY  1980    SOCIAL HISTORY:   Social History   Tobacco Use   Smoking status: Former Smoker    Packs/day: 0.50    Years: 7.00    Pack years: 3.50    Types: Cigarettes    Last attempt to quit: 10/06/1993    Years since quitting: 25.4   Smokeless tobacco: Never Used  Substance Use Topics   Alcohol use: Yes    Alcohol/week: 0.0 standard drinks    Comment: occasionally    FAMILY HISTORY:   Family History  Problem Relation Age of Onset   Hypertension Mother    Uterine cancer Mother    Berenice Primas' disease Mother    Hyperlipidemia Father    Hypertension Father    COPD Father    Fibrocystic breast disease Sister    Colon cancer Maternal Aunt    Diabetes Maternal Grandmother    Heart attack Maternal Grandmother    Hypertension Maternal Grandfather    Heart disease Maternal Grandfather    Heart attack Maternal Grandfather     DRUG ALLERGIES:   Allergies  Allergen Reactions   Latex Itching    Latex gloves, bandaids, tape   Methocarbamol Hives   Sulfa Antibiotics  Fever   Tape Other (See Comments)    Latex tape    REVIEW OF SYSTEMS:   Review of Systems  Constitutional: Positive for fever. Negative for weight loss.  HENT: Positive for sore throat. Negative for congestion, nosebleeds and tinnitus.   Eyes: Negative for blurred vision, double vision and redness.  Respiratory: Negative for cough, hemoptysis and shortness of breath.   Cardiovascular: Negative for chest pain, orthopnea, leg swelling and PND.  Gastrointestinal: Negative for abdominal pain, diarrhea, melena, nausea and vomiting.  Genitourinary: Negative for dysuria, hematuria and urgency.  Musculoskeletal: Positive for neck pain. Negative for falls and joint pain.  Neurological: Negative for  dizziness, tingling, sensory change, focal weakness, seizures, weakness and headaches.  Endo/Heme/Allergies: Negative for polydipsia. Does not bruise/bleed easily.  Psychiatric/Behavioral: Negative for depression and memory loss. The patient is not nervous/anxious.     MEDICATIONS AT HOME:   Prior to Admission medications   Medication Sig Start Date End Date Taking? Authorizing Provider  cyclobenzaprine (FLEXERIL) 10 MG tablet Take 10 mg by mouth 3 (three) times daily as needed.  02/24/19  Yes [provider]  loratadine (CLARITIN) 10 MG tablet Take 1 tablet by mouth daily as needed.  09/12/11  Yes [provider]  Multiple Vitamins-Minerals (MULTIVITAMIN GUMMIES WOMENS PO) Take 2 Doses by mouth daily.   Yes [provider]  NAPROXEN DR 500 MG EC tablet  02/24/19  Yes [provider]  pantoprazole (PROTONIX) 40 MG tablet Take 40 mg by mouth daily.  02/24/19  Yes [provider]  propranolol (INDERAL) 40 MG tablet TAKE 1 TABLET BY MOUTH EVERY 12 HOURS 05/25/18  Yes [name], MD      VITAL SIGNS:  Blood pressure (!) 152/94, pulse 62, temperature 98.2 F (36.8 C), temperature source Oral, resp. rate 18, last menstrual period 02/19/2019, SpO2 100 %.  PHYSICAL EXAMINATION:  Physical Exam  GENERAL:  53 y.o.-year-old obese patient lying in the bed with no acute distress.  EYES: Pupils equal, round, reactive to light and accommodation. No scleral icterus. Extraocular muscles intact.  HEENT: Head atraumatic, normocephalic. Oropharynx and nasopharynx clear. No oropharyngeal erythema, moist oral mucosa  NECK:  Supple, no jugular venous distention. No thyroid enlargement, no tenderness.  LUNGS: Normal breath sounds bilaterally, no wheezing, rales, rhonchi. No use of accessory muscles of respiration.  CARDIOVASCULAR: S1, S2 RRR. No murmurs, rubs, gallops, clicks.  ABDOMEN: Soft, nontender, nondistended. Bowel sounds present. No organomegaly or mass.    EXTREMITIES: No pedal edema, cyanosis, or clubbing. + 2 pedal & radial pulses b/l.   NEUROLOGIC: Cranial nerves II through XII are intact. No focal Motor or sensory deficits appreciated b/l.  PSYCHIATRIC: The patient is alert and oriented x 3.   SKIN: No obvious rash, lesion, or ulcer.   LABORATORY PANEL:   CBC Recent Labs  Lab 02/26/19 1532  WBC 9.1  HGB 11.8*  HCT 36.5  PLT 264   ------------------------------------------------------------------------------------------------------------------  Chemistries  Recent Labs  Lab 02/26/19 1532  NA 139  K 4.1  CL 103  CO2 27  GLUCOSE 164*  BUN 11  CREATININE 0.58  CALCIUM 8.8*  AST 16  ALT 14  ALKPHOS 71  BILITOT 0.6   ------------------------------------------------------------------------------------------------------------------  Cardiac Enzymes No results for input(s): TROPONINI in the last 168 hours. ------------------------------------------------------------------------------------------------------------------  RADIOLOGY:  Ct Soft Tissue Neck W Contrast  Result Date: 02/26/2019 CLINICAL DATA:  Sore throat, neck pain, and fever. EXAM: CT NECK WITH CONTRAST TECHNIQUE: Multidetector CT imaging of the  neck was performed using the standard protocol following the bolus administration of intravenous contrast. CONTRAST:  102mL OMNIPAQUE IOHEXOL 300 MG/ML  SOLN COMPARISON:  None. FINDINGS: Pharynx and larynx: Motion artifact is noted through the larynx and to a lesser extent pharynx. The pharyngeal soft tissues are symmetric without evidence of mass or definite swelling. There may be mild retropharyngeal edema, however no retropharyngeal or peritonsillar fluid collection is present. The airway is patent. Salivary glands: No inflammation, mass, or stone. Thyroid: Unremarkable. Lymph nodes: Scattered subcentimeter short axis lymph nodes in the neck bilaterally, likely reactive. Vascular: Major vascular structures of the neck are  grossly patent. Multiple small venous collaterals are noted in the left supraclavicular region and anterior chest wall. Limited intracranial: Unremarkable. Visualized orbits: Unremarkable. Mastoids and visualized paranasal sinuses: Clear. Skeleton: No acute osseous abnormality or suspicious osseous lesion. Upper chest: Clear lung apices. Other: None. IMPRESSION: Possible mild retropharyngeal edema.  No abscess. Electronically Signed   By: Logan Bores M.D.   On: 02/26/2019 18:11     IMPRESSION AND PLAN:   53 year old female with past medical history of hypertension, asthma, GERD, history of skin cancer presents to the hospital due to neck pain and sore throat and noted to have some retropharyngeal swelling on CT scan of the neck.  1.  Neck pain/sore throat-etiology unclear but suspected to be possible early retropharyngeal abscess.  Patient's CT neck although does not show any fluid collection just some mild retropharyngeal edema. - ER physician spoke to ENT who recommended admission under observation. - We will start the patient on IV Unasyn and oral prednisone empirically.  Patient received 1 dose of IV Solu-Medrol in the ER. -Follow clinically.  Placed on soft diet. -COVID-19 test was negative. -Continue patient's Flexeril.  2.  Essential hypertension-continue propranolol.  3.  GERD-continue Protonix.     All the records are reviewed and case discussed with ED provider. Management plans discussed with the patient, family and they are in agreement.  CODE STATUS: Full code  TOTAL TIME TAKING CARE OF THIS PATIENT: 40 minutes.    Henreitta Leber M.D on 02/26/2019 at 7:02 PM  Between 7am to 6pm - Pager - [phone]  After 6pm go to [email or web address] - password [email or web address] [email or web address] [facility]  Office  [phone]  CC: Primary care physician; [name], MD

## 2019-02-27 LAB — GLUCOSE, CAPILLARY
Glucose-Capillary: 177 mg/dL — ABNORMAL HIGH (ref 70–99)
Glucose-Capillary: 163 mg/dL — ABNORMAL HIGH (ref 70–99)
Glucose-Capillary: 170 mg/dL — ABNORMAL HIGH (ref 70–99)

## 2019-02-27 LAB — CBC
HCT: 36.4 % (ref 36.0–46.0)
Hemoglobin: 11.7 g/dL — ABNORMAL LOW (ref 12.0–15.0)
MCH: 26.7 pg (ref 26.0–34.0)
MCHC: 32.1 g/dL (ref 30.0–36.0)
MCV: 82.9 fL (ref 80.0–100.0)
Platelets: 250 10*3/uL (ref 150–400)
RBC: 4.39 MIL/uL (ref 3.87–5.11)
RDW: 14 % (ref 11.5–15.5)
WBC: 8.6 10*3/uL (ref 4.0–10.5)
nRBC: 0 % (ref 0.0–0.2)

## 2019-02-27 LAB — BASIC METABOLIC PANEL
Anion gap: 8 (ref 5–15)
BUN: 12 mg/dL (ref 6–20)
CO2: 23 mmol/L (ref 22–32)
Calcium: 8.6 mg/dL — ABNORMAL LOW (ref 8.9–10.3)
Chloride: 107 mmol/L (ref 98–111)
Creatinine, Ser: 0.47 mg/dL (ref 0.44–1.00)
GFR calc Af Amer: >60 mL/min (ref >60)
GFR calc non Af Amer: >60 mL/min (ref >60)
Glucose, Bld: 167 mg/dL — ABNORMAL HIGH (ref 70–99)
Potassium: 4.2 mmol/L (ref 3.5–5.1)
Sodium: 138 mmol/L (ref 135–145)

## 2019-02-27 MED ORDER — INSULIN ASPART 100 UNIT/ML ~~LOC~~ SOLN
0.0000 [IU] | Freq: Three times a day (TID) | SUBCUTANEOUS | Status: DC
  Administered 2019-02-27 – 2019-02-28 (×4): 2 [IU] via SUBCUTANEOUS
  Filled 2019-02-27 (×4): qty 1

## 2019-02-27 MED ORDER — INSULIN ASPART 100 UNIT/ML ~~LOC~~ SOLN: 0.0000 [IU] | Freq: Every day | SUBCUTANEOUS | Status: DC

## 2019-02-27 MED ORDER — DEXAMETHASONE 4 MG PO TABS
10.0000 mg | ORAL_TABLET | Freq: Three times a day (TID) | ORAL | Status: DC
  Administered 2019-02-27 – 2019-02-28 (×4): 10 mg via ORAL
  Filled 2019-02-27 (×4): qty 3

## 2019-02-27 MED ORDER — INSULIN ASPART 100 UNIT/ML ~~LOC~~ SOLN: 0.0000 [IU] | Freq: Three times a day (TID) | SUBCUTANEOUS | Status: DC

## 2019-02-27 MED ORDER — SODIUM CHLORIDE 0.9 % IV SOLN
INTRAVENOUS | Status: DC | PRN
  Administered 2019-02-27 – 2019-02-28 (×3): 500 mL via INTRAVENOUS

## 2019-02-27 MED ORDER — HYDRALAZINE HCL 20 MG/ML IJ SOLN: 10.0000 mg | Freq: Four times a day (QID) | INTRAMUSCULAR | Status: DC | PRN

## 2019-02-27 NOTE — Progress Notes (Signed)
[place] [facility] [place] NAME: Heather Bauer    MR#:  [id number]  DATE OF BIRTH:  1966/03/09  SUBJECTIVE:  patient complains of neck pain and throat pain although she is feels a bit better than yesterday. She is very hungry and wants regular food. No respiratory distress vomiting. No fever.  REVIEW OF SYSTEMS:   Review of Systems  Constitutional: Negative for chills, fever and weight loss.  HENT: Negative for ear discharge, ear pain and nosebleeds.   Eyes: Negative for blurred vision, pain and discharge.  Respiratory: Negative for sputum production, shortness of breath, wheezing and stridor.   Cardiovascular: Negative for chest pain, palpitations, orthopnea and PND.  Gastrointestinal: Negative for abdominal pain, diarrhea, nausea and vomiting.  Genitourinary: Negative for frequency and urgency.  Musculoskeletal: Positive for neck pain. Negative for back pain and joint pain.  Neurological: Negative for sensory change, speech change, focal weakness and weakness.  Psychiatric/Behavioral: Negative for depression and hallucinations. The patient is not nervous/anxious.    Tolerating Diet:yes Tolerating PT: ambulatory  DRUG ALLERGIES:   Allergies  Allergen Reactions  . Latex Itching    Latex gloves, bandaids, tape  . Methocarbamol Hives  . Sulfa Antibiotics     Fever  . Tape Other (See Comments)    Latex tape    VITALS:  Blood pressure (!) 146/86, pulse 78, temperature 97.6 F (36.4 C), resp. rate 18, last menstrual period 02/19/2019, SpO2 95 %.  PHYSICAL EXAMINATION:   Physical Exam  GENERAL:  53 y.o.-year-old patient lying in the bed with no acute distress. obese EYES: Pupils equal, round, reactive to light and accommodation. No scleral icterus. Extraocular muscles intact.  HEENT: Head atraumatic, normocephalic. Oropharynx and nasopharynx clear.  NECK:  Supple, no jugular venous distention. No thyroid enlargement, no  tenderness. Mild decreased ROM of neck ?muscle strain LUNGS: Normal breath sounds bilaterally, no wheezing, rales, rhonchi. No use of accessory muscles of respiration.  CARDIOVASCULAR: S1, S2 normal. No murmurs, rubs, or gallops.  ABDOMEN: Soft, nontender, nondistended. Bowel sounds present. No organomegaly or mass.  EXTREMITIES: No cyanosis, clubbing or edema b/l.    NEUROLOGIC: Cranial nerves II through XII are intact. No focal Motor or sensory deficits b/l.   PSYCHIATRIC:  patient is alert and oriented x 3.  SKIN: No obvious rash, lesion, or ulcer.   LABORATORY PANEL:  CBC Recent Labs  Lab 02/27/19 0530  WBC 8.6  HGB 11.7*  HCT 36.4  PLT 250    Chemistries  Recent Labs  Lab 02/26/19 1532 02/27/19 0530  NA 139 138  K 4.1 4.2  CL 103 107  CO2 27 23  GLUCOSE 164* 167*  BUN 11 12  CREATININE 0.58 0.47  CALCIUM 8.8* 8.6*  AST 16  --   ALT 14  --   ALKPHOS 71  --   BILITOT 0.6  --    Cardiac Enzymes No results for input(s): TROPONINI in the last 168 hours. RADIOLOGY:  Ct Soft Tissue Neck W Contrast  Result Date: 02/26/2019 CLINICAL DATA:  Sore throat, neck pain, and fever. EXAM: CT NECK WITH CONTRAST TECHNIQUE: Multidetector CT imaging of the neck was performed using the standard protocol following the bolus administration of intravenous contrast. CONTRAST:  33mL OMNIPAQUE IOHEXOL 300 MG/ML  SOLN COMPARISON:  None. FINDINGS: Pharynx and larynx: Motion artifact is noted through the larynx and to a lesser extent pharynx. The pharyngeal soft tissues are symmetric without evidence of mass or definite swelling. There  may be mild retropharyngeal edema, however no retropharyngeal or peritonsillar fluid collection is present. The airway is patent. Salivary glands: No inflammation, mass, or stone. Thyroid: Unremarkable. Lymph nodes: Scattered subcentimeter short axis lymph nodes in the neck bilaterally, likely reactive. Vascular: Major vascular structures of the neck are grossly  patent. Multiple small venous collaterals are noted in the left supraclavicular region and anterior chest wall. Limited intracranial: Unremarkable. Visualized orbits: Unremarkable. Mastoids and visualized paranasal sinuses: Clear. Skeleton: No acute osseous abnormality or suspicious osseous lesion. Upper chest: Clear lung apices. Other: None. IMPRESSION: Possible mild retropharyngeal edema.  No abscess. Electronically Signed   By: Logan Bores M.D.   On: 02/26/2019 18:11   ASSESSMENT AND PLAN:  53 year old female with past medical history of hypertension, asthma, GERD, history of skin cancer presents to the hospital due to neck pain and sore throat and noted to have some retropharyngeal swelling on CT scan of the neck.  1.  Neck pain/sore throat-etiology unclear but suspected to be possible early retropharyngeal abscess.  Patient's CT neck although does not show any fluid collection just some mild retropharyngeal edema. - ER physician spoke to ENT Dr Maylene Roes who recommended admission under observation. - We will start the patient on IV Unasyn and oral prednisone empirically--change to oral dexamethasose (pt did not tolerate prednisone well in the past.  Patient received 1 dose of IV Solu-Medrol inn the ER. -White count normal. No fever. -Follow clinically.  Placed on soft diet. -COVID-19 test was negative. -Continue patient's Flexeril. -Spoke with ENT Dr. Maylene Roes. Will repeat CT neck tomorrow if no improvement or worsening symptoms  2.  Essential hypertension-continue propranolol.  3.  GERD-continue Protonix.   CODE STATUS: full  DVT Prophylaxis: Lovenox  TOTAL TIME TAKING CARE OF THIS PATIENT: *30* minutes.  >50% time spent on counselling and coordination of care  POSSIBLE D/C IN *1 to 2* DAYS, DEPENDING ON CLINICAL CONDITION.  Note: This dictation was prepared with Dragon dictation along with smaller phrase technology. Any transcriptional errors that result from this process are  unintentional.  Fritzi Mandes M.D on 02/27/2019 at 11:55 AM  Between 7am to 6pm - Pager - [phone]  After 6pm go to [email or web address] - password [email or web address] [facility]  Office  [phone]  CC: Primary care physician; [name], MDPatient ID: Heather Bauer, female   DOB: [birth year], 53 y.o.   MRN: [id number]

## 2019-02-27 NOTE — Plan of Care (Signed)

## 2019-02-28 LAB — GLUCOSE, CAPILLARY
Glucose-Capillary: 172 mg/dL — ABNORMAL HIGH (ref 70–99)
Glucose-Capillary: 170 mg/dL — ABNORMAL HIGH (ref 70–99)

## 2019-02-28 MED ORDER — DEXAMETHASONE 2 MG PO TABS: 10.0000 mg | ORAL_TABLET | Freq: Two times a day (BID) | ORAL | 0 refills | Status: DC

## 2019-02-28 MED ORDER — KETOROLAC TROMETHAMINE 10 MG PO TABS
10.0000 mg | ORAL_TABLET | Freq: Three times a day (TID) | ORAL | Status: DC | PRN
  Filled 2019-02-28: qty 1

## 2019-02-28 MED ORDER — AMOXICILLIN-POT CLAVULANATE 875-125 MG PO TABS: 1.0000 | ORAL_TABLET | Freq: Two times a day (BID) | ORAL | 0 refills | Status: AC

## 2019-02-28 MED ORDER — PANTOPRAZOLE SODIUM 40 MG PO TBEC: 40.0000 mg | DELAYED_RELEASE_TABLET | Freq: Every day | ORAL | 0 refills | Status: AC

## 2019-02-28 MED ORDER — KETOROLAC TROMETHAMINE 10 MG PO TABS: 10.0000 mg | ORAL_TABLET | Freq: Three times a day (TID) | ORAL | 0 refills | Status: DC | PRN

## 2019-02-28 NOTE — Progress Notes (Signed)
Patient discharged home per MD order. All discharge instructions given and all questions answered. 

## 2019-02-28 NOTE — Discharge Summary (Signed)
[place] [place] NAME: Heather Bauer    MR#:  [id number]  DATE OF BIRTH:  05-04-1966  DATE OF ADMISSION:  02/26/2019 ADMITTING PHYSICIAN: Henreitta Leber, MD  DATE OF DISCHARGE: 02/28/2019  PRIMARY CARE PHYSICIAN: [name], MD    ADMISSION DIAGNOSIS:  Edema of throat [J39.2]  DISCHARGE DIAGNOSIS:  neck pain/throat pain-- improving slowly CT soft tissue neck shows mild retroperitoneal edema  SECONDARY DIAGNOSIS:   Past Medical History:  Diagnosis Date  . Asthma   . Cancer ([place])    skin cancer, basal cell  . GERD (gastroesophageal reflux disease)   . HTN (hypertension)     HOSPITAL COURSE:  53 year old female with past medical history of hypertension, asthma, GERD, history of skin cancer presents to the hospital due to neck pain and sore throat and noted to have some retropharyngeal swelling on CT scan of the neck.  1. Neck pain/sore throat-etiology unclear but suspected to be possible early retropharyngeal abscess. Patient's CT neck although does not show any fluid collection just some mild retropharyngeal edema. - ER physician spoke to ENT Dr Maylene Roes who recommended admission under observation. - on IV Unasyn and oral prednisone empirically--change to oral dexamethasose (pt did not tolerate prednisone well in the past. Patient received 1 dose of IV Solu-Medrol inn the ER. -White count normal. No fever. -Follow clinically. Placed on soft diet. -COVID-19 test was negative. -Continue patient's Flexeril. -Spoke with ENT Dr. Maylene Roes yday -patient feels better. No fever today. Able to swallow diet okay. White count normal. -Will change to dexamethasone taper and oral Augmentin.  -Patient will make appointment with [facility] tomorrow for follow-up in 5 to 7 days  2. Essential hypertension-continue propranolol.  3. GERD-continue Protonix.  4. Hyperglycemia secondary to steroids. A1c is 5.8. -Sliding scale  insulin   Discharge to home. Patient agreeable  CONSULTS OBTAINED:    DRUG ALLERGIES:   Allergies  Allergen Reactions  . Latex Itching    Latex gloves, bandaids, tape  . Methocarbamol Hives  . Sulfa Antibiotics     Fever  . Tape Other (See Comments)    Latex tape    DISCHARGE MEDICATIONS:   Allergies as of 02/28/2019      Reactions   Latex Itching   Latex gloves, bandaids, tape   Methocarbamol Hives   Sulfa Antibiotics    Fever   Tape Other (See Comments)   Latex tape      Medication List    TAKE these medications   amoxicillin-clavulanate 875-125 MG tablet Commonly known as:  Augmentin Take 1 tablet by mouth 2 (two) times daily for 7 days.   cyclobenzaprine 10 MG tablet Commonly known as:  FLEXERIL Take 10 mg by mouth 3 (three) times daily as needed.   dexamethasone 2 MG tablet Commonly known as:  DECADRON Take 5 tablets (10 mg total) by mouth 2 (two) times daily. (5 tabs)10 mg bid for 3 days and then  5 tabs)10 mg qd for 3 days then stop   ketorolac 10 MG tablet Commonly known as:  TORADOL Take 1 tablet (10 mg total) by mouth every 8 (eight) hours as needed for severe pain.   loratadine 10 MG tablet Commonly known as:  CLARITIN Take 1 tablet by mouth daily as needed.   MULTIVITAMIN GUMMIES WOMENS PO Take 2 Doses by mouth daily.   Naproxen DR 500 MG EC tablet Generic drug:  naproxen   pantoprazole 40 MG tablet Commonly  known as:  PROTONIX Take 1 tablet (40 mg total) by mouth daily.   propranolol 40 MG tablet Commonly known as:  INDERAL TAKE 1 TABLET BY MOUTH EVERY 12 HOURS       If you experience worsening of your admission symptoms, develop shortness of breath, life threatening emergency, suicidal or homicidal thoughts you must seek medical attention immediately by calling 911 or calling your MD immediately  if symptoms less severe.  You Must read complete instructions/literature along with all the possible adverse reactions/side effects  for all the Medicines you take and that have been prescribed to you. Take any new Medicines after you have completely understood and accept all the possible adverse reactions/side effects.   Please note  You were cared for by a hospitalist during your hospital stay. If you have any questions about your discharge medications or the care you received while you were in the hospital after you are discharged, you can call the unit and asked to speak with the hospitalist on call if the hospitalist that took care of you is not available. Once you are discharged, your primary care physician will handle any further medical issues. Please note that NO REFILLS for any discharge medications will be authorized once you are discharged, as it is imperative that you return to your primary care physician (or establish a relationship with a primary care physician if you do not have one) for your aftercare needs so that they can reassess your need for medications and monitor your lab values. Today   SUBJECTIVE   Doing well. Better than y'day  VITAL SIGNS:  Blood pressure (!) 147/70, pulse 73, temperature 98.2 F (36.8 C), resp. rate 17, last menstrual period 02/19/2019, SpO2 97 %.  I/O:    Intake/Output Summary (Last 24 hours) at 02/28/2019 1223 Last data filed at 02/28/2019 0932 Gross per 24 hour  Intake 651.77 ml  Output -  Net 651.77 ml    PHYSICAL EXAMINATION:  GENERAL:  53 y.o.-year-old patient lying in the bed with no acute distress.  EYES: Pupils equal, round, reactive to light and accommodation. No scleral icterus. Extraocular muscles intact.  HEENT: Head atraumatic, normocephalic. Oropharynx and nasopharynx clear. NO LAD NECK:  Supple, no jugular venous distention. No thyroid enlargement, no tenderness.  LUNGS: Normal breath sounds bilaterally, no wheezing, rales,rhonchi or crepitation. No use of accessory muscles of respiration.  CARDIOVASCULAR: S1, S2 normal. No murmurs, rubs, or gallops.   ABDOMEN: Soft, non-tender, non-distended. Bowel sounds present. No organomegaly or mass.  EXTREMITIES: No pedal edema, cyanosis, or clubbing.  NEUROLOGIC: Cranial nerves II through XII are intact. Muscle strength 5/5 in all extremities. Sensation intact. Gait not checked.  PSYCHIATRIC: The patient is alert and oriented x 3.  SKIN: No obvious rash, lesion, or ulcer.   DATA REVIEW:   CBC  Recent Labs  Lab 02/27/19 0530  WBC 8.6  HGB 11.7*  HCT 36.4  PLT 250    Chemistries  Recent Labs  Lab 02/26/19 1532 02/27/19 0530  NA 139 138  K 4.1 4.2  CL 103 107  CO2 27 23  GLUCOSE 164* 167*  BUN 11 12  CREATININE 0.58 0.47  CALCIUM 8.8* 8.6*  AST 16  --   ALT 14  --   ALKPHOS 71  --   BILITOT 0.6  --     Microbiology Results   Recent Results (from the past 240 hour(s))  Rapid Strep Screen ([facility] ONLY)     Status: None  Collection Time: 02/26/19 12:56 PM  Result Value Ref Range Status   Streptococcus, Group A Screen (Direct) NEGATIVE NEGATIVE Final    Comment: (NOTE) A Rapid Antigen test may result negative if the antigen level in the sample is below the detection level of this test. The FDA has not cleared this test as a stand-alone test therefore the rapid antigen negative result has reflexed to a Group A Strep culture. Performed at [facility], [address] [place], [place] [address]   Culture, group A strep     Status: None (Preliminary result)   Collection Time: 02/26/19 12:56 PM  Result Value Ref Range Status   Specimen Description   Final    THROAT Performed at [facility], [address], [place], [place] [address]    Special Requests   Final    NONE Reflexed from [id number] Performed at [facility], [address] [place], [place] [address]    Culture   Final    CULTURE REINCUBATED FOR BETTER GROWTH Performed at [facility], [place] [address] [place], [place] [address]    Report Status PENDING   Incomplete  SARS Coronavirus 2 (CEPHEID- Performed in [place] hospital lab), Hosp Order     Status: None   Collection Time: 02/26/19  4:16 PM  Result Value Ref Range Status   SARS Coronavirus 2 NEGATIVE NEGATIVE Final    Comment: (NOTE) If result is NEGATIVE SARS-CoV-2 target nucleic acids are NOT DETECTED. The SARS-CoV-2 RNA is generally detectable in upper and lower  respiratory specimens during the acute phase of infection. The lowest  concentration of SARS-CoV-2 viral copies this assay can detect is 250  copies / mL. A negative result does not preclude SARS-CoV-2 infection  and should not be used as the sole basis for treatment or other  patient management decisions.  A negative result may occur with  improper specimen collection / handling, submission of specimen other  than nasopharyngeal swab, presence of viral mutation(s) within the  areas targeted by this assay, and inadequate number of viral copies  (<250 copies / mL). A negative result must be combined with clinical  observations, patient history, and epidemiological information. If result is POSITIVE SARS-CoV-2 target nucleic acids are DETECTED. The SARS-CoV-2 RNA is generally detectable in upper and lower  respiratory specimens dur ing the acute phase of infection.  Positive  results are indicative of active infection with SARS-CoV-2.  Clinical  correlation with patient history and other diagnostic information is  necessary to determine patient infection status.  Positive results do  not rule out bacterial infection or co-infection with other viruses. If result is PRESUMPTIVE POSTIVE SARS-CoV-2 nucleic acids MAY BE PRESENT.   A presumptive positive result was obtained on the submitted specimen  and confirmed on repeat testing.  While 2019 novel coronavirus  (SARS-CoV-2) nucleic acids may be present in the submitted sample  additional confirmatory testing may be necessary for epidemiological  and / or clinical  management purposes  to differentiate between  SARS-CoV-2 and other Sarbecovirus currently known to infect humans.  If clinically indicated additional testing with an alternate test  methodology [phone]) is advised. The SARS-CoV-2 RNA is generally  detectable in upper and lower respiratory sp ecimens during the acute  phase of infection. The expected result is Negative. Fact Sheet for Patients:  StrictlyIdeas.no Fact Sheet for Healthcare Providers: BankingDealers.co.za This test is not yet approved or cleared by the [place] FDA and has  been authorized for detection and/or diagnosis of SARS-CoV-2 by FDA under an Emergency Use Authorization (EUA).  This EUA will remain in effect (meaning this test can be used) for the duration of the COVID-19 declaration under Section 564(b)(1) of the Act, 21 U.S.C. section 360bbb-3(b)(1), unless the authorization is terminated or revoked sooner. Performed at [facility], [address] [place], [place] [address]     RADIOLOGY:  Ct Soft Tissue Neck W Contrast  Result Date: 02/26/2019 CLINICAL DATA:  Sore throat, neck pain, and fever. EXAM: CT NECK WITH CONTRAST TECHNIQUE: Multidetector CT imaging of the neck was performed using the standard protocol following the bolus administration of intravenous contrast. CONTRAST:  46mL OMNIPAQUE IOHEXOL 300 MG/ML  SOLN COMPARISON:  None. FINDINGS: Pharynx and larynx: Motion artifact is noted through the larynx and to a lesser extent pharynx. The pharyngeal soft tissues are symmetric without evidence of mass or definite swelling. There may be mild retropharyngeal edema, however no retropharyngeal or peritonsillar fluid collection is present. The airway is patent. Salivary glands: No inflammation, mass, or stone. Thyroid: Unremarkable. Lymph nodes: Scattered subcentimeter short axis lymph nodes in the neck bilaterally, likely reactive. Vascular: Major  vascular structures of the neck are grossly patent. Multiple small venous collaterals are noted in the left supraclavicular region and anterior chest wall. Limited intracranial: Unremarkable. Visualized orbits: Unremarkable. Mastoids and visualized paranasal sinuses: Clear. Skeleton: No acute osseous abnormality or suspicious osseous lesion. Upper chest: Clear lung apices. Other: None. IMPRESSION: Possible mild retropharyngeal edema.  No abscess. Electronically Signed   By: Logan Bores M.D.   On: 02/26/2019 18:11     CODE STATUS:     Code Status Orders  (From admission, onward)         Start     Ordered   02/26/19 2016  Full code  Continuous     02/26/19 2015        Code Status History    This patient has a current code status but no historical code status.      TOTAL TIME TAKING CARE OF THIS PATIENT: *40* minutes.    Fritzi Mandes M.D on 02/28/2019 at 12:23 PM  Between 7am to 6pm - Pager - [phone] After 6pm go to [email or web address] - password [email or web address] [place] [facility]  Office  [phone]  CC: Primary care physician; [name], MD

## 2019-02-28 NOTE — Discharge Instructions (Signed)
Pt to f/u [facility] as out pt in 5-7 days. She will make appt

## 2019-03-01 LAB — HEMOGLOBIN A1C
Hgb A1c MFr Bld: 6 % — ABNORMAL HIGH (ref 4.8–5.6)
Mean Plasma Glucose: 126 mg/dL

## 2019-03-01 LAB — CULTURE, GROUP A STREP (THRC)

## 2019-03-02 LAB — HIV ANTIBODY (ROUTINE TESTING W REFLEX): HIV Screen 4th Generation wRfx: NONREACTIVE

## 2019-06-25 ENCOUNTER — Other Ambulatory Visit: Payer: Self-pay | Admitting: Family Medicine

## 2019-08-24 ENCOUNTER — Other Ambulatory Visit: Payer: Self-pay | Admitting: Family Medicine

## 2019-08-24 NOTE — Telephone Encounter (Signed)
Requested medication (s) are due for refill today: yes  Requested medication (s) are on the active medication list: yes  Last refill:  07/25/2019  Future visit scheduled: no  Notes to clinic:  Overdue for office visit  Review for refill   Requested Prescriptions  Pending Prescriptions Disp Refills   propranolol (INDERAL) 40 MG tablet [Pharmacy Med Name: PROPRANOLOL 40MG  TABLETS] 60 tablet 1    Sig: TAKE 1 TABLET BY MOUTH EVERY 12 HOURS     Cardiovascular:  Beta Blockers Failed - 08/24/2019 10:28 AM      Failed - Last BP in normal range    BP Readings from Last 1 Encounters:  02/28/19 (!) 147/70         Failed - Valid encounter within last 6 months    Recent Outpatient Visits          1 year ago [place], Donald E, MD   1 year ago Annual physical exam   [facility] [name], MD   2 years ago Sinusitis, unspecified chronicity, unspecified location   [facility] [name], MD   2 years ago Viral upper respiratory tract infection   [facility] [place], [place]   2 years ago Annual physical exam   [place], [place]             Passed - Last Heart Rate in normal range    Pulse Readings from Last 1 Encounters:  02/28/19 73

## 2019-08-25 NOTE — Telephone Encounter (Signed)
This patient has not been seen for nearly 2 years. Have sent 30 day prescription. Needs to be seen if office before next refill.

## 2019-09-23 ENCOUNTER — Other Ambulatory Visit: Payer: Self-pay | Admitting: Family Medicine

## 2019-09-26 ENCOUNTER — Other Ambulatory Visit: Payer: Self-pay | Admitting: Family Medicine

## 2019-10-06 ENCOUNTER — Other Ambulatory Visit: Payer: Self-pay | Admitting: Sports Medicine

## 2019-10-06 DIAGNOSIS — G8929 Other chronic pain: Secondary | ICD-10-CM

## 2019-10-06 DIAGNOSIS — M5441 Lumbago with sciatica, right side: Secondary | ICD-10-CM

## 2019-10-06 DIAGNOSIS — M533 Sacrococcygeal disorders, not elsewhere classified: Secondary | ICD-10-CM

## 2019-10-06 DIAGNOSIS — M25551 Pain in right hip: Secondary | ICD-10-CM

## 2019-10-15 ENCOUNTER — Other Ambulatory Visit: Payer: Self-pay

## 2019-10-15 ENCOUNTER — Ambulatory Visit
Admission: RE | Admit: 2019-10-15 | Discharge: 2019-10-15 | Disposition: A | Discharge status: Home / Self Care | Payer: BC Managed Care – PPO | Source: Ambulatory Visit | Attending: Sports Medicine | Admitting: Sports Medicine

## 2019-10-15 DIAGNOSIS — G8929 Other chronic pain: Secondary | ICD-10-CM | POA: Diagnosis present

## 2019-10-15 DIAGNOSIS — M5441 Lumbago with sciatica, right side: Secondary | ICD-10-CM | POA: Diagnosis present

## 2019-10-15 DIAGNOSIS — M533 Sacrococcygeal disorders, not elsewhere classified: Secondary | ICD-10-CM | POA: Insufficient documentation

## 2019-10-15 DIAGNOSIS — M25551 Pain in right hip: Secondary | ICD-10-CM | POA: Insufficient documentation

## 2019-11-07 ENCOUNTER — Other Ambulatory Visit: Payer: Self-pay | Admitting: Obstetrics & Gynecology

## 2019-11-07 DIAGNOSIS — N920 Excessive and frequent menstruation with regular cycle: Secondary | ICD-10-CM

## 2019-11-09 ENCOUNTER — Ambulatory Visit
Admission: RE | Admit: 2019-11-09 | Discharge: 2019-11-09 | Disposition: A | Discharge status: Home / Self Care | Payer: BC Managed Care – PPO | Source: Ambulatory Visit | Attending: Obstetrics & Gynecology | Admitting: Obstetrics & Gynecology

## 2019-11-09 ENCOUNTER — Other Ambulatory Visit: Payer: Self-pay

## 2019-11-09 DIAGNOSIS — N920 Excessive and frequent menstruation with regular cycle: Secondary | ICD-10-CM | POA: Insufficient documentation

## 2020-05-31 NOTE — Progress Notes (Signed)
[facility]  [address] [place], [place] [address] Phone: [phone]  Fax: [phone]   Clinic Day:  06/04/2020  Referring physician: Barbaraann Boys, MD  Chief Complaint: Heather Bauer is a 54 y.o. female with iron deficiency anemia who is referred in consultation with Dr. Barbaraann Bauer for assessment and management.   HPI: The patient saw Dr. Janene Bauer on 05/10/2020 for a routine visit. Hematocrit was 36.1, hemoglobin 11.5, MCV 85, platelets 240,000, WBC 6,700. Total bilirubin was 0.3. Ferritin was 20. She has known about her iron deficiency for years. The patient tried liquid iron years ago but could not tolerate it due to constipation.  She denies any history of pernicious anemia.  She does not think she has ever had a transfusion.  The patient has a history of heavy menses with clots. She saw Dr. Leonides Bauer after having heavy bleeding for 3 months.  Transvaginal ultrasound on 11/09/2019 revealed a diffusely heterogeneous myometrium and mild scattered areas of shadowing, nonspecific. There was no discrete uterine mass. There was a mormal appearing endometrial complex and ovaries. Endometrial biopsy on 11/09/2019 was normal.  Colonoscopy on 11/24/2016 revealed two 3 to 6 mm polyps (hyperplastic polyps) at the recto-sigmoid colon. There was a tortuous colon.  She had an upper endoscopy about 8 years ago for esophageal spasms. She has had reflux in the past.  Diagnostic mammogram at [facility] on 05/23/2020 was benign. Annual mammography was recommended.   Labs followed: 09/11/2014: Hematocrit 35.0, hemoglobin 11.8 09/14/2015: Hematocrit 33.2, hemoglobin 11.1, MCV 83.0, platelets 276,000, WBC 9,700. 04/22/2017: Hematocrit 35.2, hemoglobin 11.4, MCV 85.0, platelets 263,000, WBC 7,500. 09/23/2018: Hematocrit 34.5, hemoglobin 11.3, MCV 85.0, platelets 278,000, WBC 8,600. 02/27/2019: Hematocrit 36.4, hemoglobin 11.7, MCV 82.9, platelets 250,000, WBC  8,600. 05/10/2020: Hematocrit 36.1, hemoglobin 11.5, MCV 85.0, platelets 240,000, WBC 6,700.  Ferritin was 30 on 09/14/2015, 23 on 09/21/2017 and 20 on 09/23/2018.  B12 was 212 on 05/10/2020. TSH was 2,15 on 05/10/2020.  Hepatitis C antibody was negative on 05/10/2020.  Symptomatically, her periods are irregular. They last about 7 days and she passes a lot of clots. For the past couple of months, they have been monthly, but in 11/2019, she had spotting that lasted for 3 months. Typically, she goes through 5 pads/tampons daily. She occasionally gets dizzy/lightheaded during her periods and reports that her face gets flushed. The patient has polycystic ovary disease. The patient denies blood in the stool, hematuria, and black stools.  In terms of her diet, the patient eats yogurt or a granola bar for breakfast, She eats a salad or chicken strips for lunch. She eats a salad, hamburger, or steak for dinner. She eats protein at every meal. She is trying to lose weight, but keeps gaining weight. The patient took oral vitamin B12 about 2-3 years ago but no longer takes it because her B12 level became normal.  The patient also reports "vibrating vessel twitches" that started in her lower abdomen and spread to her legs and arms. The patient has a herniated disc in her back and recently had two steroid injections. She uses her computer a lot during the day and has eye dryness and strain. She has been having chest pain that was "really bad" yesterday. The patient denies fevers, sweats, weight loss, headaches, runny nose, sore throat, cough, shortness of breath, palpitations, nausea, vomiting, diarrhea, urinary symptoms, skin changes, numbness, weakness, and balance or coordination problems.  The patient's mother had uterine cancer. Her father had small cell lung  cancer and passed this year. Her paternal uncle had colon cancer and is doing well.   Past Medical History:  Diagnosis Date  . Asthma   . Cancer ([place])     skin cancer, basal cell  . GERD (gastroesophageal reflux disease)   . HTN (hypertension)     Past Surgical History:  Procedure Laterality Date  . ADENOIDECTOMY  01/2001  . BASAL CELL CARCINOMA EXCISION  01/06/2008  . COLONOSCOPY WITH PROPOFOL N/A 11/24/2016   Procedure: COLONOSCOPY WITH PROPOFOL;  Surgeon: Lollie Sails, MD;  Location[facility] ENDOSCOPY;  Service: Endoscopy;  Laterality: N/A;  . POLYPECTOMY     benign  . TONSILLECTOMY  1980    Family History  Problem Relation Age of Onset  . Hypertension Mother   . Uterine cancer Mother   . Graves' disease Mother   . Hyperlipidemia Father   . Hypertension Father   . COPD Father   . Lung cancer Father   . Fibrocystic breast disease Sister   . Colon cancer Maternal Aunt   . Diabetes Maternal Grandmother   . Heart attack Maternal Grandmother   . Hypertension Maternal Grandfather   . Heart disease Maternal Grandfather   . Heart attack Maternal Grandfather     Social History:  reports that she quit smoking about 26 years ago. Her smoking use included cigarettes. She has a 3.50 pack-year smoking history. She has never used smokeless tobacco. She reports current alcohol use. She reports that she does not use drugs.The patient does not use alcohol or tobacco. She is exposed to radiation at work. 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 The patient is alone today.  Allergies:  Allergies  Allergen Reactions  . Latex Itching    Latex gloves, bandaids, tape  . Methocarbamol Hives  . Sulfa Antibiotics     Fever  . Tape Other (See Comments)    Latex tape    Current Medications: Current Outpatient Medications  Medication Sig Dispense Refill  . chlorzoxazone (PARAFON) 500 MG tablet TAKE ONE-HALF TABLET BY MOUTH TWICE DAILY AS NEEDED FOR MUSCLE SPASMS FOR UP TO 30 DAYS    . propranolol (INDERAL) 40 MG tablet TAKE 1 TABLET BY MOUTH EVERY 12 HOURS 60 tablet 0  . cyclobenzaprine (FLEXERIL) 10 MG tablet Take 10  mg by mouth 3 (three) times daily as needed.  (Patient not taking: Reported on 06/04/2020)    . dexamethasone (DECADRON) 2 MG tablet Take 5 tablets (10 mg total) by mouth 2 (two) times daily. (5 tabs)10 mg bid for 3 days and then  5 tabs)10 mg qd for 3 days then stop (Patient not taking: Reported on 06/04/2020) 45 tablet 0  . ketorolac (TORADOL) 10 MG tablet Take 1 tablet (10 mg total) by mouth every 8 (eight) hours as needed for severe pain. (Patient not taking: Reported on 06/04/2020) 15 tablet 0  . loratadine (CLARITIN) 10 MG tablet Take 1 tablet by mouth daily as needed.  (Patient not taking: Reported on 06/04/2020)    . Multiple Vitamins-Minerals (MULTIVITAMIN GUMMIES WOMENS PO) Take 2 Doses by mouth daily. (Patient not taking: Reported on 06/04/2020)    . NAPROXEN DR 500 MG EC tablet  (Patient not taking: Reported on 06/04/2020)    . pantoprazole (PROTONIX) 40 MG tablet Take 1 tablet (40 mg total) by mouth daily. (Patient not taking: Reported on 06/04/2020) 7 tablet 0   No current facility-administered medications for this visit.    Review of Systems  Constitutional: Negative for chills, diaphoresis, fever, malaise/fatigue and weight loss (reports weight gain).  HENT: Negative for congestion, ear discharge, ear pain, hearing loss, nosebleeds, sinus pain, sore throat and tinnitus.   Eyes: Negative for blurred vision.       Dry and strained eyes.  Respiratory: Negative for cough, hemoptysis, sputum production and shortness of breath.   Cardiovascular: Positive for chest pain. Negative for palpitations and leg swelling.  Gastrointestinal: Negative for abdominal pain, blood in stool, constipation, diarrhea, heartburn, melena, nausea and vomiting.  Genitourinary: Negative for dysuria, frequency, hematuria and urgency.       PCOS, irregular periods with clots.  Musculoskeletal: Positive for back pain (herniated disk). Negative for joint pain, myalgias and neck pain.  Skin: Negative for itching and  rash.  Neurological: Positive for dizziness (during menses, occasional). Negative for tingling, sensory change, weakness and headaches.  Endo/Heme/Allergies: Does not bruise/bleed easily.  Psychiatric/Behavioral: Negative for depression and memory loss. The patient is not nervous/anxious and does not have insomnia.   All other systems reviewed and are negative.  Performance status (ECOG): 1  Vitals Blood pressure (!) 146/74, pulse (!) 55, temperature 98.9 F (37.2 C), temperature source Tympanic, resp. rate 18, height 5\' 7"  (1.702 m), weight 254 lb 1.3 oz (115.2 kg), SpO2 98 %.   Physical Exam Vitals and nursing note reviewed.  Constitutional:      General: She is not in acute distress.    Appearance: She is not diaphoretic.  HENT:     Head: Normocephalic and atraumatic.     Comments: Long dark blonde/brown hair.    Mouth/Throat:     Mouth: Mucous membranes are moist.     Pharynx: Oropharynx is clear.  Eyes:     General: No scleral icterus.    Extraocular Movements: Extraocular movements intact.     Conjunctiva/sclera: Conjunctivae normal.     Pupils: Pupils are equal, round, and reactive to light.     Comments: Glasses.  Brown eyes.  Cardiovascular:     Rate and Rhythm: Normal rate and regular rhythm.     Heart sounds: Normal heart sounds. No murmur heard.   Pulmonary:     Effort: Pulmonary effort is normal. No respiratory distress.     Breath sounds: No wheezing or rales.  Chest:     Chest wall: No tenderness.  Abdominal:     General: Bowel sounds are normal. There is no distension.     Palpations: Abdomen is soft. There is no hepatomegaly, splenomegaly or mass.     Tenderness: There is no abdominal tenderness. There is no guarding or rebound.  Musculoskeletal:        General: No swelling or tenderness. Normal range of motion.     Cervical back: Normal range of motion and neck supple.  Lymphadenopathy:     Head:     Right side of head: No preauricular, posterior  auricular or occipital adenopathy.     Left side of head: No preauricular, posterior auricular or occipital adenopathy.     Cervical: No cervical adenopathy.     Upper Body:     Right upper body: No supraclavicular or axillary adenopathy.     Left upper body: No supraclavicular or axillary adenopathy.     Lower Body: No right inguinal adenopathy. No left inguinal adenopathy.  Skin:    General: Skin is warm and dry.  Neurological:     Mental Status: She is alert and oriented to person, place, and time. Mental status is  at baseline.  Psychiatric:        Behavior: Behavior normal.        Thought Content: Thought content normal.        Judgment: Judgment normal.    No visits with results within 3 Day(s) from this visit.  Latest known visit with results is:  Admission on 02/26/2019, Discharged on 02/28/2019  Component Date Value Ref Range Status  . Lactic Acid, Venous 02/26/2019 1.6  0.5 - 1.9 mmol/L Final   Performed at [facility], [address] [place], [place] [address]  . Sodium 02/26/2019 139  135 - 145 mmol/L Final  . Potassium 02/26/2019 4.1  3.5 - 5.1 mmol/L Final  . Chloride 02/26/2019 103  98 - 111 mmol/L Final  . CO2 02/26/2019 27  22 - 32 mmol/L Final  . Glucose, Bld 02/26/2019 164* 70 - 99 mg/dL Final  . BUN 02/26/2019 11  6 - 20 mg/dL Final  . Creatinine, Ser 02/26/2019 0.58  0.44 - 1.00 mg/dL Final  . Calcium 02/26/2019 8.8* 8.9 - 10.3 mg/dL Final  . Total Protein 02/26/2019 7.4  6.5 - 8.1 g/dL Final  . Albumin 02/26/2019 4.3  3.5 - 5.0 g/dL Final  . AST 02/26/2019 16  15 - 41 U/L Final  . ALT 02/26/2019 14  0 - 44 U/L Final  . Alkaline Phosphatase 02/26/2019 71  38 - 126 U/L Final  . Total Bilirubin 02/26/2019 0.6  0.3 - 1.2 mg/dL Final  . GFR calc non Af Amer 02/26/2019 >60  >60 mL/min Final  . GFR calc Af Amer 02/26/2019 >60  >60 mL/min Final  . Anion gap 02/26/2019 9  5 - 15 Final   Performed at [facility], [address],  [place], [place] [address]  . WBC 02/26/2019 9.1  4.0 - 10.5 K/uL Final  . RBC 02/26/2019 4.33  3.87 - 5.11 MIL/uL Final  . Hemoglobin 02/26/2019 11.8* 12.0 - 15.0 g/dL Final  . HCT 02/26/2019 36.5  36 - 46 % Final  . MCV 02/26/2019 84.3  80.0 - 100.0 fL Final  . MCH 02/26/2019 27.3  26.0 - 34.0 pg Final  . MCHC 02/26/2019 32.3  30.0 - 36.0 g/dL Final  . RDW 02/26/2019 14.1  11.5 - 15.5 % Final  . Platelets 02/26/2019 264  150 - 400 K/uL Final  . nRBC 02/26/2019 0.0  0.0 - 0.2 % Final  . Neutrophils Relative % 02/26/2019 69  % Final  . Neutro Abs 02/26/2019 6.2  1.7 - 7.7 K/uL Final  . Lymphocytes Relative 02/26/2019 23  % Final  . Lymphs Abs 02/26/2019 2.1  0.7 - 4.0 K/uL Final  . Monocytes Relative 02/26/2019 5  % Final  . Monocytes Absolute 02/26/2019 0.5  0 - 1 K/uL Final  . Eosinophils Relative 02/26/2019 3  % Final  . Eosinophils Absolute 02/26/2019 0.3  0 - 0 K/uL Final  . Basophils Relative 02/26/2019 0  % Final  . Basophils Absolute 02/26/2019 0.0  0 - 0 K/uL Final  . Immature Granulocytes 02/26/2019 0  % Final  . Abs Immature Granulocytes 02/26/2019 0.03  0.00 - 0.07 K/uL Final   Performed at [facility], [address], [place], [place] [address]  . Color, Urine 02/26/2019 YELLOW* YELLOW Final  . APPearance 02/26/2019 HAZY* CLEAR Final  . Specific Gravity, Urine 02/26/2019 1.019  1.005 - 1.030 Final  . pH 02/26/2019 6.0  5.0 - 8.0 Final  . Glucose, UA 02/26/2019 NEGATIVE  NEGATIVE mg/dL Final  . Hgb urine dipstick 02/26/2019 NEGATIVE  NEGATIVE Final  . Bilirubin Urine 02/26/2019 NEGATIVE  NEGATIVE Final  . Ketones, ur 02/26/2019 NEGATIVE  NEGATIVE mg/dL Final  . Protein, ur 02/26/2019 NEGATIVE  NEGATIVE mg/dL Final  . Nitrite 02/26/2019 NEGATIVE  NEGATIVE Final  . Chalmers Guest 02/26/2019 NEGATIVE  NEGATIVE Final  . RBC / HPF 02/26/2019 0-5  0 - 5 RBC/hpf Final  . WBC, UA 02/26/2019 0-5  0 - 5 WBC/hpf Final  . Bacteria, UA 02/26/2019 NONE SEEN  NONE SEEN Final  .  Squamous Epithelial / LPF 02/26/2019 0-5  0 - 5 Final  . Mucus 02/26/2019 PRESENT   Final   Performed at [facility], [address] [place], [place] [address]  . SARS Coronavirus 2 02/26/2019 NEGATIVE  NEGATIVE Final   Comment: (NOTE) If result is NEGATIVE SARS-CoV-2 target nucleic acids are NOT DETECTED. The SARS-CoV-2 RNA is generally detectable in upper and lower  respiratory specimens during the acute phase of infection. The lowest  concentration of SARS-CoV-2 viral copies this assay can detect is 250  copies / mL. A negative result does not preclude SARS-CoV-2 infection  and should not be used as the sole basis for treatment or other  patient management decisions.  A negative result may occur with  improper specimen collection / handling, submission of specimen other  than nasopharyngeal swab, presence of viral mutation(s) within the  areas targeted by this assay, and inadequate number of viral copies  (<250 copies / mL). A negative result must be combined with clinical  observations, patient history, and epidemiological information. If result is POSITIVE SARS-CoV-2 target nucleic acids are DETECTED. The SARS-CoV-2 RNA is generally detectable in upper and lower  respiratory specimens dur                          ing the acute phase of infection.  Positive  results are indicative of active infection with SARS-CoV-2.  Clinical  correlation with patient history and other diagnostic information is  necessary to determine patient infection status.  Positive results do  not rule out bacterial infection or co-infection with other viruses. If result is PRESUMPTIVE POSTIVE SARS-CoV-2 nucleic acids MAY BE PRESENT.   A presumptive positive result was obtained on the submitted specimen  and confirmed on repeat testing.  While 2019 novel coronavirus  (SARS-CoV-2) nucleic acids may be present in the submitted sample  additional confirmatory testing may be necessary for  epidemiological  and / or clinical management purposes  to differentiate between  SARS-CoV-2 and other Sarbecovirus currently known to infect humans.  If clinically indicated additional testing with an alternate test  methodology [phone]) is advised. The SARS-CoV-2 RNA is generally  detectable in upper and lower respiratory sp                          ecimens during the acute  phase of infection. The expected result is Negative. Fact Sheet for Patients:  StrictlyIdeas.no Fact Sheet for Healthcare Providers: BankingDealers.co.za This test is not yet approved or cleared by the [place] FDA and has been authorized for detection and/or diagnosis of SARS-CoV-2 by FDA under an Emergency Use Authorization (EUA).  This EUA will remain in effect (meaning this test can be used) for the duration of the COVID-19 declaration under Section 564(b)(1) of the Act, 21 U.S.C. section 360bbb-3(b)(1), unless the authorization is terminated or revoked sooner.  Performed at [facility], [address], [place], [place] [address]   . Preg Test, Ur 02/26/2019 NEGATIVE  NEGATIVE Final   Comment:        THE SENSITIVITY OF THIS METHODOLOGY IS >24 mIU/mL   . HIV Screen 4th Generation wRfx 02/27/2019 Non Reactive  Non Reactive Final   Comment: (NOTE) Performed At: [facility] [place], [place] [id number] Rush Farmer MD WC:[id number]   . Sodium 02/27/2019 138  135 - 145 mmol/L Final  . Potassium 02/27/2019 4.2  3.5 - 5.1 mmol/L Final  . Chloride 02/27/2019 107  98 - 111 mmol/L Final  . CO2 02/27/2019 23  22 - 32 mmol/L Final  . Glucose, Bld 02/27/2019 167* 70 - 99 mg/dL Final  . BUN 02/27/2019 12  6 - 20 mg/dL Final  . Creatinine, Ser 02/27/2019 0.47  0.44 - 1.00 mg/dL Final  . Calcium 02/27/2019 8.6* 8.9 - 10.3 mg/dL Final  . GFR calc non Af Amer 02/27/2019 >60  >60 mL/min Final  . GFR calc Af Amer 02/27/2019 >60  >60  mL/min Final  . Anion gap 02/27/2019 8  5 - 15 Final   Performed at [facility], [address] [place], [place] [address]  . WBC 02/27/2019 8.6  4.0 - 10.5 K/uL Final  . RBC 02/27/2019 4.39  3.87 - 5.11 MIL/uL Final  . Hemoglobin 02/27/2019 11.7* 12.0 - 15.0 g/dL Final  . HCT 02/27/2019 36.4  36 - 46 % Final  . MCV 02/27/2019 82.9  80.0 - 100.0 fL Final  . MCH 02/27/2019 26.7  26.0 - 34.0 pg Final  . MCHC 02/27/2019 32.1  30.0 - 36.0 g/dL Final  . RDW 02/27/2019 14.0  11.5 - 15.5 % Final  . Platelets 02/27/2019 250  150 - 400 K/uL Final  . nRBC 02/27/2019 0.0  0.0 - 0.2 % Final   Performed at [facility], [address], [place],  [address]  . Hgb A1c MFr Bld 02/27/2019 6.0* 4.8 - 5.6 % Final   Comment: (NOTE)         Prediabetes: 5.7 - 6.4         Diabetes: >6.4         Glycemic control for adults with diabetes: <7.0   . Mean Plasma Glucose 02/27/2019 126  mg/dL Final   Comment: (NOTE) Performed At: [facility] [place], [place] [id number] Rush Farmer MD VO:[id number]   . Glucose-Capillary 02/27/2019 177* 70 - 99 mg/dL Final  . Glucose-Capillary 02/27/2019 163* 70 - 99 mg/dL Final  . Glucose-Capillary 02/27/2019 170* 70 - 99 mg/dL Final  . Glucose-Capillary 02/28/2019 172* 70 - 99 mg/dL Final  . Glucose-Capillary 02/28/2019 170* 70 - 99 mg/dL Final    Assessment:  ABRYANA LYKENS is a 54 y.o. female with a normocytic anemia.  She notes a history of heavy menses.  Diet appears good.  Labs on 05/10/2020 revealed a hematocrit 36.1, hemoglobin 11.5, MCV 85.0, platelets 240,000, WBC 6,700.  B12 was 212 on 05/10/2020. TSH was 2,15 on 05/10/2020.  Hepatitis C antibody was negative on 05/10/2020.  Ferritin has been followed:  30 on 09/14/2015, 23 on 09/21/2017 and 20 on 09/23/2018.    Colonoscopy on 11/24/2016 revealed two 3 to 6 mm polyps (hyperplastic polyps) at the recto-sigmoid colon. There was a tortuous colon.  She had an  upper endoscopy about 8 years ago for esophageal spasms. She has has reflux.  The patient has not  received the COVID-19 vaccine.  Symptomatically, she notes heavy menses.  Exam is normal.  Plan: 1.   Labs today: CBC with differential, retake, ferritin, iron studies, folate, antiparietal antibody, intrinsic factor antibody.    2.   Normocytic anemia  Patient has a history of iron deficiency anemia with a low ferritin likely due to heavy menses.   She denies any melena, hematochezia or hematuria.   Colonoscopy in 2018 was negative.   Notes constipation associate associated with oral iron.   Consider IV iron.  Potential side effects reviewed.  Information provided.   Ferritin goal 100.   Preauth Venofer.  She has B12 deficiency.   B12 was 212 on 05/10/2020.   B12 goal is 400.   Discuss initiation of oral B12.   Follow-up B12 level in 1 month.    If level remains low then begin B12 injections 3.   RTC in 1 week for MD assessment, review of labs, and +/- Venofer.  I discussed the assessment and treatment plan with the patient.  The patient was provided an opportunity to ask questions and all were answered.  The patient agreed with the plan and demonstrated an understanding of the instructions.  The patient was advised to call back if the symptoms worsen or if the condition fails to improve as anticipated.  I provided 26 minutes of face-to-face time during this this encounter and > 50% was spent counseling as documented under my assessment and plan.  An additional 10 minutes were spent reviewing her chart (Epic and Care Everywhere) including notes, labs, and imaging studies.    Heather Saber C. Mike Gip, MD, PhD    06/04/2020, 11:35 AM  I, Heather Bauer, am acting as Education administrator for [employer]. Mike Gip, MD, PhD.  I, Heather Ikner C. Mike Gip, MD, have reviewed the above documentation for accuracy and completeness, and I agree with the above.

## 2020-06-04 ENCOUNTER — Encounter: Payer: Self-pay | Admitting: Hematology and Oncology

## 2020-06-04 ENCOUNTER — Inpatient Hospital Stay: Payer: BC Managed Care – PPO | Attending: Hematology and Oncology | Admitting: Hematology and Oncology

## 2020-06-04 ENCOUNTER — Other Ambulatory Visit: Payer: Self-pay

## 2020-06-04 ENCOUNTER — Inpatient Hospital Stay: Payer: BC Managed Care – PPO

## 2020-06-04 VITALS — BP 146/74 | HR 55 | Temp 98.9°F | Resp 18 | Ht 67.0 in | Wt 254.1 lb

## 2020-06-04 DIAGNOSIS — E538 Deficiency of other specified B group vitamins: Secondary | ICD-10-CM

## 2020-06-04 DIAGNOSIS — I10 Essential (primary) hypertension: Secondary | ICD-10-CM | POA: Insufficient documentation

## 2020-06-04 DIAGNOSIS — D509 Iron deficiency anemia, unspecified: Secondary | ICD-10-CM | POA: Diagnosis present

## 2020-06-04 DIAGNOSIS — Z87891 Personal history of nicotine dependence: Secondary | ICD-10-CM | POA: Insufficient documentation

## 2020-06-04 DIAGNOSIS — Z800 Family history of malignant neoplasm of digestive organs: Secondary | ICD-10-CM | POA: Diagnosis not present

## 2020-06-04 DIAGNOSIS — D125 Benign neoplasm of sigmoid colon: Secondary | ICD-10-CM | POA: Insufficient documentation

## 2020-06-04 DIAGNOSIS — E282 Polycystic ovarian syndrome: Secondary | ICD-10-CM | POA: Insufficient documentation

## 2020-06-04 DIAGNOSIS — Z801 Family history of malignant neoplasm of trachea, bronchus and lung: Secondary | ICD-10-CM | POA: Diagnosis not present

## 2020-06-04 LAB — FERRITIN: Ferritin: 13 ng/mL (ref 11–307)

## 2020-06-04 LAB — IRON AND TIBC
Iron: 45 ug/dL (ref 28–170)
Saturation Ratios: 11 % (ref 10.4–31.8)
TIBC: 410 ug/dL (ref 250–450)
UIBC: 365 ug/dL

## 2020-06-04 LAB — CBC WITH DIFFERENTIAL/PLATELET
Abs Immature Granulocytes: 0.03 10*3/uL (ref 0.00–0.07)
Basophils Absolute: 0 10*3/uL (ref 0.0–0.1)
Basophils Relative: 0 %
Eosinophils Absolute: 0.2 10*3/uL (ref 0.0–0.5)
Eosinophils Relative: 3 %
HCT: 35.5 % — ABNORMAL LOW (ref 36.0–46.0)
Hemoglobin: 11.6 g/dL — ABNORMAL LOW (ref 12.0–15.0)
Immature Granulocytes: 0 %
Lymphocytes Relative: 31 %
Lymphs Abs: 2.3 10*3/uL (ref 0.7–4.0)
MCH: 26.9 pg (ref 26.0–34.0)
MCHC: 32.7 g/dL (ref 30.0–36.0)
MCV: 82.2 fL (ref 80.0–100.0)
Monocytes Absolute: 0.4 10*3/uL (ref 0.1–1.0)
Monocytes Relative: 6 %
Neutro Abs: 4.3 10*3/uL (ref 1.7–7.7)
Neutrophils Relative %: 60 %
Platelets: 257 10*3/uL (ref 150–400)
RBC: 4.32 MIL/uL (ref 3.87–5.11)
RDW: 14.8 % (ref 11.5–15.5)
WBC: 7.3 10*3/uL (ref 4.0–10.5)
nRBC: 0 % (ref 0.0–0.2)

## 2020-06-04 LAB — RETICULOCYTES
Immature Retic Fract: 24.8 % — ABNORMAL HIGH (ref 2.3–15.9)
RBC.: 4.36 MIL/uL (ref 3.87–5.11)
Retic Count, Absolute: 77.6 10*3/uL (ref 19.0–186.0)
Retic Ct Pct: 1.8 % (ref 0.4–3.1)

## 2020-06-04 LAB — FOLATE: Folate: 13.3 ng/mL (ref >5.9)

## 2020-06-04 NOTE — Patient Instructions (Signed)

## 2020-06-04 NOTE — Progress Notes (Signed)
No c/o noted today

## 2020-06-05 LAB — ANTI-PARIETAL ANTIBODY: Parietal Cell Antibody-IgG: 0.9 Units (ref 0.0–20.0)

## 2020-06-05 LAB — INTRINSIC FACTOR ANTIBODIES: Intrinsic Factor: 1 AU/mL (ref 0.0–1.1)

## 2020-06-12 ENCOUNTER — Ambulatory Visit: Payer: BC Managed Care – PPO

## 2020-06-12 ENCOUNTER — Ambulatory Visit: Payer: BC Managed Care – PPO | Admitting: Hematology and Oncology

## 2020-06-12 NOTE — Progress Notes (Signed)
[facility]  [address], [address] [place], [place] [address] Phone: [phone]  Fax: [phone]   Clinic Day:  06/13/2020  Referring physician: Barbaraann Boys, MD  Chief Complaint: Heather Bauer is a 54 y.o. female with iron deficiency anemia and B12 deficiency who is seen for review of work-up and discussion regarding direction of therapy.  HPI: The patient was last seen in the hematology clinic on 06/04/2020 for new patient assessment. At that time, she noted a history of heavy menses.  Diet appeared good.  Exam was normal.  Work-up revealed a hematocrit of 35.5, hemoglobin 11.6, MCV 82.2, platelets 257,000, WBC 7,300. Ferritin was 13 with an iron saturation of 11% and a TIBC of 410. Reticulocyte count was 1.8%. Parietal cell antibody-IgG was 0.9 and intrinsic factor was 1.0. Folate was 13.3.  During the interim, she has been tired all the time. Her eyes are still strained from working at the computer all day. She recently got prescription computer glasses. Her back pain is stable.  Oral iron causes her to become constipated and liquid iron causes her to vomit. She has tried cooking in an iron skillet and increase her meat intake. She eats a lot of salads.  She would like to try IV iron in the future.   Past Medical History:  Diagnosis Date  . Asthma   . Cancer ([place])    skin cancer, basal cell  . GERD (gastroesophageal reflux disease)   . HTN (hypertension)     Past Surgical History:  Procedure Laterality Date  . ADENOIDECTOMY  01/2001  . BASAL CELL CARCINOMA EXCISION  01/06/2008  . COLONOSCOPY WITH PROPOFOL N/A 11/24/2016   Procedure: COLONOSCOPY WITH PROPOFOL;  Surgeon: Lollie Sails, MD;  Location: [facility] ENDOSCOPY;  Service: Endoscopy;  Laterality: N/A;  . POLYPECTOMY     benign  . TONSILLECTOMY  1980    Family History  Problem Relation Age of Onset  . Hypertension Mother   . Uterine cancer Mother   . Graves' disease Mother   .  Hyperlipidemia Father   . Hypertension Father   . COPD Father   . Lung cancer Father   . Fibrocystic breast disease Sister   . Colon cancer Maternal Aunt   . Diabetes Maternal Grandmother   . Heart attack Maternal Grandmother   . Hypertension Maternal Grandfather   . Heart disease Maternal Grandfather   . Heart attack Maternal Grandfather     Social History:  reports that she quit smoking about 26 years ago. Her smoking use included cigarettes. She has a 3.50 pack-year smoking history. She has never used smokeless tobacco. She reports current alcohol use. She reports that she does not use drugs.The patient does not use alcohol or tobacco. She is exposed to radiation at work. 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 The patient is alone today.  Allergies:  Allergies  Allergen Reactions  . Latex Itching    Latex gloves, bandaids, tape  . Methocarbamol Hives  . Sulfa Antibiotics     Fever  . Tape Other (See Comments)    Latex tape    Current Medications: Current Outpatient Medications  Medication Sig Dispense Refill  . chlorzoxazone (PARAFON) 500 MG tablet TAKE ONE-HALF TABLET BY MOUTH TWICE DAILY AS NEEDED FOR MUSCLE SPASMS FOR UP TO 30 DAYS    . propranolol (INDERAL) 40 MG tablet TAKE 1 TABLET BY MOUTH EVERY 12 HOURS 60 tablet 0  . cyclobenzaprine (FLEXERIL) 10  MG tablet Take 10 mg by mouth 3 (three) times daily as needed.  (Patient not taking: Reported on 06/04/2020)    . dexamethasone (DECADRON) 2 MG tablet Take 5 tablets (10 mg total) by mouth 2 (two) times daily. (5 tabs)10 mg bid for 3 days and then  5 tabs)10 mg qd for 3 days then stop (Patient not taking: Reported on 06/04/2020) 45 tablet 0  . ketorolac (TORADOL) 10 MG tablet Take 1 tablet (10 mg total) by mouth every 8 (eight) hours as needed for severe pain. (Patient not taking: Reported on 06/04/2020) 15 tablet 0  . loratadine (CLARITIN) 10 MG tablet Take 1 tablet by mouth daily as needed.  (Patient not  taking: Reported on 06/04/2020)    . Multiple Vitamins-Minerals (MULTIVITAMIN GUMMIES WOMENS PO) Take 2 Doses by mouth daily. (Patient not taking: Reported on 06/04/2020)    . NAPROXEN DR 500 MG EC tablet  (Patient not taking: Reported on 06/04/2020)    . pantoprazole (PROTONIX) 40 MG tablet Take 1 tablet (40 mg total) by mouth daily. (Patient not taking: Reported on 06/04/2020) 7 tablet 0   No current facility-administered medications for this visit.    Review of Systems  Constitutional: Positive for malaise/fatigue (tired all the time) and weight loss (1 lb). Negative for chills, diaphoresis and fever.  HENT: Negative for congestion, ear discharge, ear pain, hearing loss, nosebleeds, sinus pain, sore throat and tinnitus.   Eyes: Negative for blurred vision.       Dry and strained eyes.  Respiratory: Negative for cough, hemoptysis, sputum production and shortness of breath.   Cardiovascular: Negative for chest pain, palpitations and leg swelling.  Gastrointestinal: Negative for abdominal pain, blood in stool, constipation, diarrhea, heartburn, melena, nausea and vomiting.  Genitourinary: Negative for dysuria, frequency, hematuria and urgency.       PCOS, irregular periods with clots.  Musculoskeletal: Positive for back pain (herniated disk). Negative for joint pain, myalgias and neck pain.  Skin: Negative for itching and rash.  Neurological: Negative for dizziness, tingling, sensory change, weakness and headaches.  Endo/Heme/Allergies: Does not bruise/bleed easily.  Psychiatric/Behavioral: Negative for depression and memory loss. The patient is not nervous/anxious and does not have insomnia.   All other systems reviewed and are negative.  Performance status (ECOG): 1  Vitals Blood pressure 136/75, pulse (!) 58, temperature (!) 97 F (36.1 C), temperature source Tympanic, resp. rate 18, weight 253 lb 8.5 oz (115 kg), SpO2 99 %.   Physical Exam Vitals and nursing note reviewed.    Constitutional:      General: She is not in acute distress.    Appearance: She is not diaphoretic.  HENT:     Head:     Comments: Long dark blonde/brown hair. Eyes:     General: No scleral icterus.    Conjunctiva/sclera: Conjunctivae normal.     Comments: Glasses.  Brown eyes.  Abdominal:     Palpations: There is no hepatomegaly or splenomegaly.  Neurological:     Mental Status: She is alert and oriented to person, place, and time. Mental status is at baseline.  Psychiatric:        Behavior: Behavior normal.        Thought Content: Thought content normal.        Judgment: Judgment normal.    No visits with results within 3 Day(s) from this visit.  Latest known visit with results is:  Appointment on 06/04/2020  Component Date Value Ref Range Status  . Parietal Cell  Antibody-IgG 06/04/2020 0.9  0.0 - 20.0 Units Final   Comment: (NOTE)                                Negative    0.0 - 20.0                                Equivocal  20.1 - 24.9                                Positive         >24.9 Parietal Cell Antibodies are found in 90% of patients with pernicious anemia and 30% of first degree relatives with pernicious anemia. Performed At: [facility] [place], [place] [id number] Rush Farmer MD OM:[id number]   . Intrinsic Factor 06/04/2020 1.0  0.0 - 1.1 AU/mL Final   Comment: (NOTE) Performed At: [facility] [place], [place] [id number] Rush Farmer MD [id number]   . Folate 06/04/2020 13.3  >5.9 ng/mL Final   Performed at [facility], [address] [place], [place] [address]  . Iron 06/04/2020 45  28 - 170 ug/dL Final  . TIBC 06/04/2020 410  250 - 450 ug/dL Final  . Saturation Ratios 06/04/2020 11  10.4 - 31.8 % Final  . UIBC 06/04/2020 365  ug/dL Final   Performed at [facility], [address] [place], [place] [address]  . Ferritin 06/04/2020 13  11 - 307 ng/mL Final   Performed at  [facility], [address] [place], [place] [address]  . Retic Ct Pct 06/04/2020 1.8  0.4 - 3.1 % Final  . RBC. 06/04/2020 4.36  3.87 - 5.11 MIL/uL Final  . Retic Count, Absolute 06/04/2020 77.6  19.0 - 186.0 K/uL Final  . Immature Retic Fract 06/04/2020 24.8* 2.3 - 15.9 % Final   Performed at [facility], [address], [place], [place] [address]  . WBC 06/04/2020 7.3  4.0 - 10.5 K/uL Final  . RBC 06/04/2020 4.32  3.87 - 5.11 MIL/uL Final  . Hemoglobin 06/04/2020 11.6* 12.0 - 15.0 g/dL Final  . HCT 06/04/2020 35.5* 36 - 46 % Final  . MCV 06/04/2020 82.2  80.0 - 100.0 fL Final  . MCH 06/04/2020 26.9  26.0 - 34.0 pg Final  . MCHC 06/04/2020 32.7  30.0 - 36.0 g/dL Final  . RDW 06/04/2020 14.8  11.5 - 15.5 % Final  . Platelets 06/04/2020 257  150 - 400 K/uL Final  . nRBC 06/04/2020 0.0  0.0 - 0.2 % Final  . Neutrophils Relative % 06/04/2020 60  % Final  . Neutro Abs 06/04/2020 4.3  1.7 - 7.7 K/uL Final  . Lymphocytes Relative 06/04/2020 31  % Final  . Lymphs Abs 06/04/2020 2.3  0.7 - 4.0 K/uL Final  . Monocytes Relative 06/04/2020 6  % Final  . Monocytes Absolute 06/04/2020 0.4  0 - 1 K/uL Final  . Eosinophils Relative 06/04/2020 3  % Final  . Eosinophils Absolute 06/04/2020 0.2  0 - 0 K/uL Final  . Basophils Relative 06/04/2020 0  % Final  . Basophils Absolute 06/04/2020 0.0  0 - 0 K/uL Final  . Immature Granulocytes 06/04/2020 0  % Final  . Abs Immature Granulocytes 06/04/2020 0.03  0.00 - 0.07 K/uL  Final   Performed at [facility], [address] [place], [place] [address]    Assessment:  Heather Bauer is a 54 y.o. female with a normocytic anemia.  She notes a history of heavy menses.  Diet appears good.  Labs on 05/10/2020 revealed a hematocrit 36.1, hemoglobin 11.5, MCV 85.0, platelets 240,000, WBC 6,700.  B12 was 212 on 05/10/2020. TSH was 2,15 on 05/10/2020.  Hepatitis C antibody was negative on 05/10/2020.  Work-up on 06/04/2020 revealed  a hematocrit of 35.5, hemoglobin 11.6, MCV 82.2, platelets 257,000, WBC 7,300. Ferritin was 13 with an iron saturation of 11% and a TIBC of 410. Reticulocyte count was 1.8%. Parietal cell antibody-IgG was 0.9 and intrinsic factor was 1.0. Folate was 13.3.  Ferritin has been followed:  30 on 09/14/2015, 23 on 09/21/2017, 20 on 09/23/2018, and 13 on 06/04/2020.    Colonoscopy on 11/24/2016 revealed two 3 to 6 mm polyps (hyperplastic polyps) at the recto-sigmoid colon. There was a tortuous colon.  She had an upper endoscopy about 8 years ago for esophageal spasms. She has has reflux.  She has B12 deficiency.  B12 was 212 on 05/10/2020.  She was on oral B12.  Parietal cell antibody and intrinsic factor antibody were negative on 06/04/2020.  Folate was 13.3.  The patient has not received the COVID-19 vaccine.  Symptomatically, she feels tired all of the the time.  She has not started oral B12.   Plan: 1.   Review labs from 06/04/2020. 2.   Iron deficiency anemia  Hematocrit 35.5.  Hemoglobin 11.6.  MCV 82.2 on 06/04/2020.     Ferritin 13 with an iron saturation of 11% and a TIBC of 410.    She has heavy menses.  She denies any melena, hematochezia or hematuria.  Colonoscopy in 2018 was negative.  She notes constipation associate associated with oral iron.  Discuss IV iron.  Information provided.  Ferritin goal 100. 3.   B12 deficiency  B12 was 212 on 05/10/2020.  B12 goal is 400.  Encourage patient to start oral B12. 4.   No Venofer today. 5.   RTC in 1 month for MD assessment, labs (CBC, ferritin, iron studies, B12- day before), +/- B12 and +/- Venofer.  I discussed the assessment and treatment plan with the patient.  The patient was provided an opportunity to ask questions and all were answered.  The patient agreed with the plan and demonstrated an understanding of the instructions.  The patient was advised to call back if the symptoms worsen or if the condition fails to improve as  anticipated.  I provided 22 minutes of face-to-face time during this this encounter and > 50% was spent counseling as documented under my assessment and plan.   Greig Altergott C. Mike Gip, MD, PhD    06/13/2020, 3:42 PM  I, Mirian Mo Tufford, am acting as Education administrator for [employer]. Mike Gip, MD, PhD.  I, Estel Scholze C. Mike Gip, MD, have reviewed the above documentation for accuracy and completeness, and I agree with the above.

## 2020-06-13 ENCOUNTER — Inpatient Hospital Stay: Payer: BC Managed Care – PPO | Attending: Hematology and Oncology | Admitting: Hematology and Oncology

## 2020-06-13 ENCOUNTER — Other Ambulatory Visit: Payer: Self-pay

## 2020-06-13 ENCOUNTER — Encounter: Payer: Self-pay | Admitting: Hematology and Oncology

## 2020-06-13 VITALS — BP 136/75 | HR 58 | Temp 97.0°F | Resp 18 | Wt 253.5 lb

## 2020-06-13 DIAGNOSIS — I10 Essential (primary) hypertension: Secondary | ICD-10-CM | POA: Insufficient documentation

## 2020-06-13 DIAGNOSIS — E611 Iron deficiency: Secondary | ICD-10-CM

## 2020-06-13 DIAGNOSIS — N920 Excessive and frequent menstruation with regular cycle: Secondary | ICD-10-CM | POA: Insufficient documentation

## 2020-06-13 DIAGNOSIS — M549 Dorsalgia, unspecified: Secondary | ICD-10-CM | POA: Diagnosis not present

## 2020-06-13 DIAGNOSIS — E538 Deficiency of other specified B group vitamins: Secondary | ICD-10-CM | POA: Diagnosis not present

## 2020-06-13 DIAGNOSIS — D509 Iron deficiency anemia, unspecified: Secondary | ICD-10-CM | POA: Diagnosis present

## 2020-06-13 DIAGNOSIS — Z79899 Other long term (current) drug therapy: Secondary | ICD-10-CM | POA: Insufficient documentation

## 2020-06-13 NOTE — Patient Instructions (Signed)
Take B12 1000 mcg by mouth once a day.    Iron Sucrose injection What is this medicine? IRON SUCROSE (AHY ern SOO krohs) is an iron complex. Iron is used to make healthy red blood cells, which carry oxygen and nutrients throughout the body. This medicine is used to treat iron deficiency anemia in people with chronic kidney disease. This medicine may be used for other purposes; ask your health care provider or pharmacist if you have questions. COMMON BRAND NAME(S): Venofer What should I tell my health care provider before I take this medicine? They need to know if you have any of these conditions:  anemia not caused by low iron levels  heart disease  high levels of iron in the blood  kidney disease  liver disease  an unusual or allergic reaction to iron, other medicines, foods, dyes, or preservatives  pregnant or trying to get pregnant  breast-feeding How should I use this medicine? This medicine is for infusion into a vein. It is given by a health care professional in a hospital or clinic setting. Talk to your pediatrician regarding the use of this medicine in children. While this drug may be prescribed for children as young as 2 years for selected conditions, precautions do apply. Overdosage: If you think you have taken too much of this medicine contact a poison control center or emergency room at once. NOTE: This medicine is only for you. Do not share this medicine with others. What if I miss a dose? It is important not to miss your dose. Call your doctor or health care professional if you are unable to keep an appointment. What may interact with this medicine? Do not take this medicine with any of the following medications:  deferoxamine  dimercaprol  other iron products This medicine may also interact with the following medications:  chloramphenicol  deferasirox This list may not describe all possible interactions. Give your health care provider a list of all  the medicines, herbs, non-prescription drugs, or dietary supplements you use. Also tell them if you smoke, drink alcohol, or use illegal drugs. Some items may interact with your medicine. What should I watch for while using this medicine? Visit your doctor or healthcare professional regularly. Tell your doctor or healthcare professional if your symptoms do not start to get better or if they get worse. You may need blood work done while you are taking this medicine. You may need to follow a special diet. Talk to your doctor. Foods that contain iron include: whole grains/cereals, dried fruits, beans, or peas, leafy green vegetables, and organ meats (liver, kidney). What side effects may I notice from receiving this medicine? Side effects that you should report to your doctor or health care professional as soon as possible:  allergic reactions like skin rash, itching or hives, swelling of the face, lips, or tongue  breathing problems  changes in blood pressure  cough  fast, irregular heartbeat  feeling faint or lightheaded, falls  fever or chills  flushing, sweating, or hot feelings  joint or muscle aches/pains  seizures  swelling of the ankles or feet  unusually weak or tired Side effects that usually do not require medical attention (report to your doctor or health care professional if they continue or are bothersome):  diarrhea  feeling achy  headache  irritation at site where injected  nausea, vomiting  stomach upset  tiredness This list may not describe all possible side effects. Call your doctor for medical advice about side effects.  You may report side effects to FDA at 1-800-FDA-1088. Where should I keep my medicine? This drug is given in a hospital or clinic and will not be stored at home. NOTE: This sheet is a summary. It may not cover all possible information. If you have questions about this medicine, talk to your doctor, pharmacist, or health care  provider.  2020 Elsevier/Gold Standard (2011-07-03 17:14:35)

## 2020-06-13 NOTE — Progress Notes (Signed)
Patient reports occasional chest pain and SOB with exertion.

## 2020-06-25 ENCOUNTER — Other Ambulatory Visit: Payer: Self-pay | Admitting: Physical Medicine and Rehabilitation

## 2020-06-25 DIAGNOSIS — M25551 Pain in right hip: Secondary | ICD-10-CM

## 2020-07-06 ENCOUNTER — Other Ambulatory Visit: Payer: Self-pay

## 2020-07-06 ENCOUNTER — Ambulatory Visit
Admission: RE | Admit: 2020-07-06 | Discharge: 2020-07-06 | Disposition: A | Discharge status: Home / Self Care | Payer: BC Managed Care – PPO | Source: Ambulatory Visit | Attending: Physical Medicine and Rehabilitation | Admitting: Physical Medicine and Rehabilitation

## 2020-07-06 DIAGNOSIS — M25551 Pain in right hip: Secondary | ICD-10-CM | POA: Diagnosis not present

## 2020-07-10 ENCOUNTER — Ambulatory Visit: Payer: BC Managed Care – PPO

## 2020-07-11 ENCOUNTER — Inpatient Hospital Stay: Payer: BC Managed Care – PPO | Attending: Hematology and Oncology

## 2020-07-11 ENCOUNTER — Other Ambulatory Visit: Payer: Self-pay

## 2020-07-11 DIAGNOSIS — Z87891 Personal history of nicotine dependence: Secondary | ICD-10-CM | POA: Insufficient documentation

## 2020-07-11 DIAGNOSIS — E611 Iron deficiency: Secondary | ICD-10-CM

## 2020-07-11 DIAGNOSIS — D509 Iron deficiency anemia, unspecified: Secondary | ICD-10-CM | POA: Insufficient documentation

## 2020-07-11 DIAGNOSIS — I10 Essential (primary) hypertension: Secondary | ICD-10-CM | POA: Insufficient documentation

## 2020-07-11 DIAGNOSIS — M62830 Muscle spasm of back: Secondary | ICD-10-CM | POA: Diagnosis not present

## 2020-07-11 DIAGNOSIS — Z85828 Personal history of other malignant neoplasm of skin: Secondary | ICD-10-CM | POA: Diagnosis not present

## 2020-07-11 DIAGNOSIS — M25559 Pain in unspecified hip: Secondary | ICD-10-CM | POA: Insufficient documentation

## 2020-07-11 DIAGNOSIS — E538 Deficiency of other specified B group vitamins: Secondary | ICD-10-CM | POA: Insufficient documentation

## 2020-07-11 LAB — IRON AND TIBC
Iron: 45 ug/dL (ref 28–170)
Saturation Ratios: 11 % (ref 10.4–31.8)
TIBC: 410 ug/dL (ref 250–450)
UIBC: 365 ug/dL

## 2020-07-11 LAB — CBC
HCT: 34.7 % — ABNORMAL LOW (ref 36.0–46.0)
Hemoglobin: 11 g/dL — ABNORMAL LOW (ref 12.0–15.0)
MCH: 26.6 pg (ref 26.0–34.0)
MCHC: 31.7 g/dL (ref 30.0–36.0)
MCV: 84 fL (ref 80.0–100.0)
Platelets: 244 10*3/uL (ref 150–400)
RBC: 4.13 MIL/uL (ref 3.87–5.11)
RDW: 14.6 % (ref 11.5–15.5)
WBC: 6.6 10*3/uL (ref 4.0–10.5)
nRBC: 0 % (ref 0.0–0.2)

## 2020-07-11 LAB — FERRITIN: Ferritin: 23 ng/mL (ref 11–307)

## 2020-07-11 NOTE — Progress Notes (Signed)
No new changes noted today. The patient name was verified by phone.

## 2020-07-11 NOTE — Progress Notes (Signed)
[facility]  [address], [address] [place], [place] [address] Phone: [phone]  Fax: [phone]   Clinic Day:  07/12/2020  Referring physician: Barbaraann Boys, MD  Chief Complaint: Heather Bauer is a 54 y.o. female with iron deficiency anemia and B12 deficiency who is seen for 1 month assessment.  HPI: The patient was last seen in the hematology clinic on 06/13/2020. At that time, she felt tired all of the the time.  Labs on 06/04/2020 revealed a hematocrit of 35.5, hemoglobin 11.6, and MCV 82.2.  Ferritin was 13 with an iron saturation of 11% and a TIBC 410.  Folate was normal.  She had not started oral B12. We discussed starting oral B12.  We discussed IV iron.  She wished to postpone Venofer for 1 month.  Labs on 07/11/2020 included a hematocrit of 34.7, hemoglobin 11.0, and MCV 84.0.  Ferritin was 23 with an iron saturation of 23% and a TIBC 410.  During the interim, she has been "ok".  She reports feeling tired. She started taking Vitamin B12 1000 mcg after her last visit. She denies any bleeding except for her menstrual cycle. Her periods are not extremely heavy but she does pass clots.  She reports that she had severe hip pain and back spasms for about 3 days last week and was unable to walk. She had an MRI on Friday. Her pain has improved.  The patient has not taken oral iron in several years. She would like to try oral iron again to see if she can tolerate it.   Past Medical History:  Diagnosis Date  . Asthma   . Cancer ([place])    skin cancer, basal cell  . GERD (gastroesophageal reflux disease)   . HTN (hypertension)     Past Surgical History:  Procedure Laterality Date  . ADENOIDECTOMY  01/2001  . BASAL CELL CARCINOMA EXCISION  01/06/2008  . COLONOSCOPY WITH PROPOFOL N/A 11/24/2016   Procedure: COLONOSCOPY WITH PROPOFOL;  Surgeon: Lollie Sails, MD;  Location: [facility] ENDOSCOPY;  Service: Endoscopy;  Laterality: N/A;  . POLYPECTOMY      benign  . TONSILLECTOMY  1980    Family History  Problem Relation Age of Onset  . Hypertension Mother   . Uterine cancer Mother   . Graves' disease Mother   . Hyperlipidemia Father   . Hypertension Father   . COPD Father   . Lung cancer Father   . Fibrocystic breast disease Sister   . Colon cancer Maternal Aunt   . Diabetes Maternal Grandmother   . Heart attack Maternal Grandmother   . Hypertension Maternal Grandfather   . Heart disease Maternal Grandfather   . Heart attack Maternal Grandfather     Social History:  reports that she quit smoking about 26 years ago. Her smoking use included cigarettes. She has a 3.50 pack-year smoking history. She has never used smokeless tobacco. She reports current alcohol use. She reports that she does not use drugs.The patient does not use alcohol or tobacco. She is exposed to radiation at work. 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 The patient is alone today.  Allergies:  Allergies  Allergen Reactions  . Latex Itching    Latex gloves, bandaids, tape  . Methocarbamol Hives  . Sulfa Antibiotics     Fever  . Tape Other (See Comments)    Latex tape    Current Medications: Current Outpatient Medications  Medication Sig Dispense Refill  .  chlorzoxazone (PARAFON) 500 MG tablet TAKE ONE-HALF TABLET BY MOUTH TWICE DAILY AS NEEDED FOR MUSCLE SPASMS FOR UP TO 30 DAYS    . propranolol (INDERAL) 40 MG tablet TAKE 1 TABLET BY MOUTH EVERY 12 HOURS 60 tablet 0  . cyclobenzaprine (FLEXERIL) 10 MG tablet Take 10 mg by mouth 3 (three) times daily as needed.  (Patient not taking: Reported on 06/04/2020)    . dexamethasone (DECADRON) 2 MG tablet Take 5 tablets (10 mg total) by mouth 2 (two) times daily. (5 tabs)10 mg bid for 3 days and then  5 tabs)10 mg qd for 3 days then stop (Patient not taking: Reported on 06/04/2020) 45 tablet 0  . ketorolac (TORADOL) 10 MG tablet Take 1 tablet (10 mg total) by mouth every 8 (eight) hours as needed  for severe pain. (Patient not taking: Reported on 06/04/2020) 15 tablet 0  . loratadine (CLARITIN) 10 MG tablet Take 1 tablet by mouth daily as needed.  (Patient not taking: Reported on 06/04/2020)    . Multiple Vitamins-Minerals (MULTIVITAMIN GUMMIES WOMENS PO) Take 2 Doses by mouth daily. (Patient not taking: Reported on 06/04/2020)    . NAPROXEN DR 500 MG EC tablet  (Patient not taking: Reported on 06/04/2020)    . pantoprazole (PROTONIX) 40 MG tablet Take 1 tablet (40 mg total) by mouth daily. (Patient not taking: Reported on 06/04/2020) 7 tablet 0   No current facility-administered medications for this visit.    Review of Systems  Constitutional: Positive for malaise/fatigue (tired). Negative for chills, diaphoresis, fever and weight loss (up 1 lb).       Feels "ok".  HENT: Negative for congestion, ear discharge, ear pain, hearing loss, nosebleeds, sinus pain, sore throat and tinnitus.   Eyes: Negative for blurred vision.  Respiratory: Negative for cough, hemoptysis, sputum production and shortness of breath.   Cardiovascular: Negative for chest pain, palpitations and leg swelling.  Gastrointestinal: Negative for abdominal pain, blood in stool, constipation, diarrhea, heartburn, melena, nausea and vomiting.  Genitourinary: Negative for dysuria, frequency, hematuria and urgency.       PCOS, irregular periods with clots.  Musculoskeletal: Positive for back pain (back spasms) and joint pain (hip). Negative for myalgias and neck pain.  Skin: Negative for itching and rash.  Neurological: Negative for dizziness, tingling, sensory change, weakness and headaches.  Endo/Heme/Allergies: Does not bruise/bleed easily.  Psychiatric/Behavioral: Negative for depression and memory loss. The patient is not nervous/anxious and does not have insomnia.   All other systems reviewed and are negative.  Performance status (ECOG): 1  Vitals Blood pressure (!) 146/95, pulse (!) 59, temperature 98.9 F (37.2 C),  temperature source Tympanic, weight 254 lb 15.4 oz (115.6 kg), SpO2 99 %.   Physical Exam Vitals and nursing note reviewed.  Constitutional:      General: She is not in acute distress.    Appearance: She is not diaphoretic.  HENT:     Head: Normocephalic and atraumatic.     Comments: Long dark blonde hair.    Mouth/Throat:     Mouth: Mucous membranes are moist.     Pharynx: Oropharynx is clear.  Eyes:     General: No scleral icterus.    Conjunctiva/sclera: Conjunctivae normal.     Comments: Glasses.  Brown eyes.  Cardiovascular:     Rate and Rhythm: Normal rate and regular rhythm.     Heart sounds: Normal heart sounds. No murmur heard.   Pulmonary:     Effort: Pulmonary effort is normal. No respiratory  distress.     Breath sounds: Normal breath sounds. No wheezing or rales.  Chest:     Chest wall: No tenderness.  Abdominal:     General: Bowel sounds are normal. There is no distension.     Palpations: Abdomen is soft. There is no hepatomegaly, splenomegaly or mass.     Tenderness: There is no abdominal tenderness. There is no guarding or rebound.  Musculoskeletal:        General: No swelling or tenderness. Normal range of motion.     Cervical back: Normal range of motion and neck supple.  Lymphadenopathy:     Head:     Right side of head: No preauricular, posterior auricular or occipital adenopathy.     Left side of head: No preauricular, posterior auricular or occipital adenopathy.     Cervical: No cervical adenopathy.     Upper Body:     Right upper body: No supraclavicular or axillary adenopathy.     Left upper body: No supraclavicular or axillary adenopathy.     Lower Body: No right inguinal adenopathy. No left inguinal adenopathy.  Skin:    General: Skin is warm and dry.  Neurological:     Mental Status: She is alert and oriented to person, place, and time. Mental status is at baseline.  Psychiatric:        Behavior: Behavior normal.        Thought Content: Thought  content normal.        Judgment: Judgment normal.    Appointment on 07/11/2020  Component Date Value Ref Range Status  . Iron 07/11/2020 45  28 - 170 ug/dL Final  . TIBC 07/11/2020 410  250 - 450 ug/dL Final  . Saturation Ratios 07/11/2020 11  10.4 - 31.8 % Final  . UIBC 07/11/2020 365  ug/dL Final   Performed at [facility], [address], [place], [place] [address]  . Ferritin 07/11/2020 23  11 - 307 ng/mL Final   Performed at [facility], [address] [place], [place] [address]  . WBC 07/11/2020 6.6  4.0 - 10.5 K/uL Final  . RBC 07/11/2020 4.13  3.87 - 5.11 MIL/uL Final  . Hemoglobin 07/11/2020 11.0* 12.0 - 15.0 g/dL Final  . HCT 07/11/2020 34.7* 36 - 46 % Final  . MCV 07/11/2020 84.0  80.0 - 100.0 fL Final  . MCH 07/11/2020 26.6  26.0 - 34.0 pg Final  . MCHC 07/11/2020 31.7  30.0 - 36.0 g/dL Final  . RDW 07/11/2020 14.6  11.5 - 15.5 % Final  . Platelets 07/11/2020 244  150 - 400 K/uL Final  . nRBC 07/11/2020 0.0  0.0 - 0.2 % Final   Performed at [facility], [address], [place],  [address]    Assessment:  Heather Bauer is a 54 y.o. female with a normocytic anemia.  She notes a history of heavy menses.  Diet appears good.  Labs on 05/10/2020 revealed a hematocrit 36.1, hemoglobin 11.5, MCV 85.0, platelets 240,000, WBC 6,700.  B12 was 212 on 05/10/2020. TSH was 2,15 on 05/10/2020.  Hepatitis C antibody was negative on 05/10/2020.  Work-up on 06/04/2020 revealed a hematocrit of 35.5, hemoglobin 11.6, MCV 82.2, platelets 257,000, WBC 7,300. Ferritin was 13 with an iron saturation of 11% and a TIBC of 410. Reticulocyte count was 1.8%. Parietal cell antibody-IgG was 0.9 and intrinsic factor was 1.0. Folate was 13.3.  Ferritin has been followed:  30 on 09/14/2015, 23 on 09/21/2017,  20 on 09/23/2018, 13 on 06/04/2020, and 23 on 07/11/2020.    Colonoscopy on 11/24/2016 revealed two 3 to 6 mm polyps (hyperplastic polyps) at the  recto-sigmoid colon. There was a tortuous colon.  She had an upper endoscopy about 8 years ago for esophageal spasms. She has has reflux.  She has B12 deficiency.  B12 was 212 on 05/10/2020.  She was on oral B12.  Parietal cell antibody and intrinsic factor antibody were negative on 06/04/2020.  Folate was 13.3.  The patient has not received the COVID-19 vaccine.  Symptomatically, she feels tired.  She denies any bleeding except for her menstrual cycle.  Exam is stable.  Plan: 1.   Labs today:  B12. 2.   Review labs from 07/11/2020 3.   Iron deficiency anemia  Hematocrit 35.5.  Hemoglobin 11.6.  MCV 82.2 on 06/04/2020.     Ferritin 13 with an iron saturation of 11% and a TIBC of 410.    Hematocrit 34.7.  Hemoglobin 11.0.  MCV 84.0 on 07/11/2020.   Ferritin 23 with an iron saturation of 11% and a TIBC of 410.  She has heavy menses.  She denies any other bleeding.  Colonoscopy in 2018 was negative.  She notes constipation associate associated with oral iron.  Ferritin goal 100.  Patient wishes to try oral iron again. 4.   B12 deficiency  B12 was 212 on 05/10/2020 and 408 today.  B12 goal is 400.  Continue oral B12. 5.   RN to call with B12 level.  If B12 < 400, begin B12 injections. 6.   RTC in 1 month for labs (CBC, ferritin). 7.   RN to call if Venofer needed. 8.   RTC in 3 months for MD assessment, labs (CBC with diff, ferritin- day before) and +/- Venofer.  I discussed the assessment and treatment plan with the patient.  The patient was provided an opportunity to ask questions and all were answered.  The patient agreed with the plan and demonstrated an understanding of the instructions.  The patient was advised to call back if the symptoms worsen or if the condition fails to improve as anticipated.  I provided 18 minutes of face-to-face time during this this encounter and > 50% was spent counseling as documented under my assessment and plan.   Vuong Musa C. Mike Gip, MD, PhD     07/12/2020, 10:05 AM  I, Mirian Mo Tufford, am acting as Education administrator for [employer]. Mike Gip, MD, PhD.  I, Shia Eber C. Mike Gip, MD, have reviewed the above documentation for accuracy and completeness, and I agree with the above.

## 2020-07-12 ENCOUNTER — Inpatient Hospital Stay (HOSPITAL_BASED_OUTPATIENT_CLINIC_OR_DEPARTMENT_OTHER): Payer: BC Managed Care – PPO | Admitting: Hematology and Oncology

## 2020-07-12 ENCOUNTER — Encounter: Payer: Self-pay | Admitting: Hematology and Oncology

## 2020-07-12 ENCOUNTER — Inpatient Hospital Stay: Payer: BC Managed Care – PPO

## 2020-07-12 VITALS — BP 146/95 | HR 59 | Temp 98.9°F | Wt 255.0 lb

## 2020-07-12 DIAGNOSIS — D509 Iron deficiency anemia, unspecified: Secondary | ICD-10-CM

## 2020-07-12 DIAGNOSIS — E538 Deficiency of other specified B group vitamins: Secondary | ICD-10-CM | POA: Diagnosis not present

## 2020-07-12 LAB — VITAMIN B12: Vitamin B-12: 408 pg/mL (ref 180–914)

## 2020-07-12 NOTE — Patient Instructions (Signed)
Please take oral iron with a source of vitamin C (juice or tablet).    Iron tablets, capsules, extended-release tablets What is this medicine? IRON (AHY ern) replaces iron that is essential to healthy red blood cells. Iron is used to treat iron deficiency anemia. Anemia may cause problems like tiredness, shortness of breath, or slowed growth in children. Only take iron if your doctor has told you to. Do not treat yourself with iron if you are feeling tired. Most healthy people get enough iron in their diets, particularly if they eat cereals, meat, poultry, and fish. This medicine may be used for other purposes; ask your health care provider or pharmacist if you have questions. COMMON BRAND NAME(S): Berneda Rose Ferrous Sulfate, Bifera, Duofer, EZFE, Feosol, Feosol Complete, [employer], [place], [place], [place], [place], [place] 150, Ferrex-150, Ferrimin, Ferro-Sequels, Journalist, newspaper, Academic librarian, [place], iFerex 150, Nephro-Fer, Niferex, NovaFerrum, Nu-Iron, Poly-Iron, ProFe, Proferrin ES, Slow Fe, Slow Iron, Tandem What should I tell my health care provider before I take this medicine? They need to know if you have any of these conditions:  frequently drink alcohol  bowel disease  hemolytic anemia  iron overload (hemochromatosis, hemosiderosis)  liver disease  problems with swallowing  stomach ulcer or other stomach problems  an unusual or allergic reaction to iron, other medicines, foods, dyes, or preservatives  pregnant or trying to get pregnant  breast-feeding How should I use this medicine? Take this medicine by mouth with a glass of water or fruit juice. Follow the directions on the prescription label. Swallow whole. Do not crush or chew. Take this medicine in an upright or sitting position. Try to take any bedtime doses at least 10 minutes before lying down. You may take this medicine with food. Take your medicine at regular intervals. Do not take your medicine more  often than directed. Do not stop taking except on your doctor's advice. Talk to your pediatrician regarding the use of this medicine in children. While this drug may be prescribed for selected conditions, precautions do apply. Overdosage: If you think you have taken too much of this medicine contact a poison control center or emergency room at once. NOTE: This medicine is only for you. Do not share this medicine with others. What if I miss a dose? If you miss a dose, take it as soon as you can. If it is almost time for your next dose, take only that dose. Do not take double or extra doses. What may interact with this medicine? If you are taking this iron product, you should not take iron in any other medicine or dietary supplement. This medicine may also interact with the following medications:  alendronate  antacids  cefdinir  chloramphenicol  cholestyramine  deferoxamine  dimercaprol  etidronate  medicines for stomach ulcers or other stomach problems  pancreatic enzymes  quinolone antibiotics (examples: Cipro, Floxin, Levaquin, Tequin and others)  risedronate  tetracycline antibiotics (examples: doxycycline, tetracycline, minocycline, and others)  thyroid hormones This list may not describe all possible interactions. Give your health care provider a list of all the medicines, herbs, non-prescription drugs, or dietary supplements you use. Also tell them if you smoke, drink alcohol, or use illegal drugs. Some items may interact with your medicine. What should I watch for while using this medicine? Use iron supplements only as directed by your health care professional. Dennis Bast will need important blood work while you are taking this medicine. It may take 3 to 6 months of therapy to treat low iron levels.  Pregnant women should follow the dose and length of iron treatment as directed by their doctors. Do not use iron longer than prescribed, and do not take a higher dose than  recommended. Long-term use may cause excess iron to build-up in the body. Do not take iron with antacids. If you need to take an antacid, take it 2 hours after a dose of iron. What side effects may I notice from receiving this medicine? Side effects that you should report to your doctor or health care professional as soon as possible:  allergic reactions like skin rash, itching or hives, swelling of the face, lips, or tongue  blue lips, nails, or palms  dark colored stools (this may be due to the iron, but can indicate a more serious condition)  drowsiness  pain with or difficulty swallowing  pale or clammy skin  seizures  stomach pain  unusually weak or tired  vomiting  weak, fast, or irregular heartbeat Side effects that usually do not require medical attention (report to your doctor or health care professional if they continue or are bothersome):  constipation  indigestion  nausea or stomach upset This list may not describe all possible side effects. Call your doctor for medical advice about side effects. You may report side effects to FDA at 1-800-FDA-1088. Where should I keep my medicine? Keep out of the reach of children. Even small amounts of iron can be harmful to a child. Store at room temperature between 15 and 30 degrees C (59 and 86 degrees F). Keep container tightly closed. Throw away any unused medicine after the expiration date. NOTE: This sheet is a summary. It may not cover all possible information. If you have questions about this medicine, talk to your doctor, pharmacist, or health care provider.  2020 Elsevier/Gold Standard (2008-02-08 17:03:41)

## 2020-08-09 ENCOUNTER — Other Ambulatory Visit: Payer: BC Managed Care – PPO

## 2020-08-14 ENCOUNTER — Inpatient Hospital Stay: Payer: BC Managed Care – PPO | Attending: Hematology and Oncology

## 2020-08-14 ENCOUNTER — Other Ambulatory Visit: Payer: Self-pay

## 2020-08-14 ENCOUNTER — Telehealth: Payer: Self-pay

## 2020-08-14 DIAGNOSIS — E538 Deficiency of other specified B group vitamins: Secondary | ICD-10-CM | POA: Insufficient documentation

## 2020-08-14 DIAGNOSIS — D509 Iron deficiency anemia, unspecified: Secondary | ICD-10-CM | POA: Diagnosis present

## 2020-08-14 LAB — CBC
HCT: 35.6 % — ABNORMAL LOW (ref 36.0–46.0)
Hemoglobin: 11.4 g/dL — ABNORMAL LOW (ref 12.0–15.0)
MCH: 26.8 pg (ref 26.0–34.0)
MCHC: 32 g/dL (ref 30.0–36.0)
MCV: 83.6 fL (ref 80.0–100.0)
Platelets: 258 10*3/uL (ref 150–400)
RBC: 4.26 MIL/uL (ref 3.87–5.11)
RDW: 14.6 % (ref 11.5–15.5)
WBC: 7.1 10*3/uL (ref 4.0–10.5)
nRBC: 0 % (ref 0.0–0.2)

## 2020-08-14 LAB — FERRITIN: Ferritin: 23 ng/mL (ref 11–307)

## 2020-08-14 NOTE — Telephone Encounter (Signed)
Called patient regarding ferritin, no answer, voicemail not set up

## 2020-08-14 NOTE — Telephone Encounter (Signed)
-----   Message from Lequita Asal, MD sent at 08/14/2020  2:59 PM EST ----- Regarding: Please call patient  Ferritin is 23 (no change in past month).  She has been on oral iron.  Does she want to continue oral iron or try IV iron?  M  ----- Message ----- From: Interface, Lab In [place] Sent: 08/14/2020   9:05 AM EST To: Lequita Asal, MD

## 2020-08-15 ENCOUNTER — Telehealth: Payer: Self-pay

## 2020-08-15 NOTE — Telephone Encounter (Signed)
Patient states that she wants to continue oral and getting iron in her diet. Her next lab appt is 10/10/20

## 2020-08-15 NOTE — Telephone Encounter (Signed)
-----   Message from Lequita Asal, MD sent at 08/14/2020  2:59 PM EST ----- Regarding: Please call patient  Ferritin is 23 (no change in past month).  She has been on oral iron.  Does she want to continue oral iron or try IV iron?  M  ----- Message ----- From: Interface, Lab In [place] Sent: 08/14/2020   9:05 AM EST To: Lequita Asal, MD

## 2020-10-10 ENCOUNTER — Inpatient Hospital Stay: Payer: BC Managed Care – PPO

## 2020-10-10 ENCOUNTER — Telehealth: Payer: Self-pay | Admitting: Hematology and Oncology

## 2020-10-11 ENCOUNTER — Inpatient Hospital Stay: Payer: BC Managed Care – PPO

## 2020-10-11 ENCOUNTER — Inpatient Hospital Stay: Payer: BC Managed Care – PPO | Admitting: Hematology and Oncology

## 2020-10-12 NOTE — Telephone Encounter (Signed)
Spoke to pt. and she stated she has new insurance and new job. She wants to verify herself about the insurance to make sure she is covered. She said she will call back to reschedule and further appts.  By Aubery Lapping

## 2021-11-07 IMAGING — US US PELVIS COMPLETE WITH TRANSVAGINAL
1 series · 13 of 25 positions shown · non-contrast
Comparison: None

CLINICAL DATA: Menorrhagia with regular cycle, 2 weeks of bleeding



[Series 1: us pelvis complete with transvaginal · 0.24mm/px · 13 of 70 slices shown]
[im 1/70]
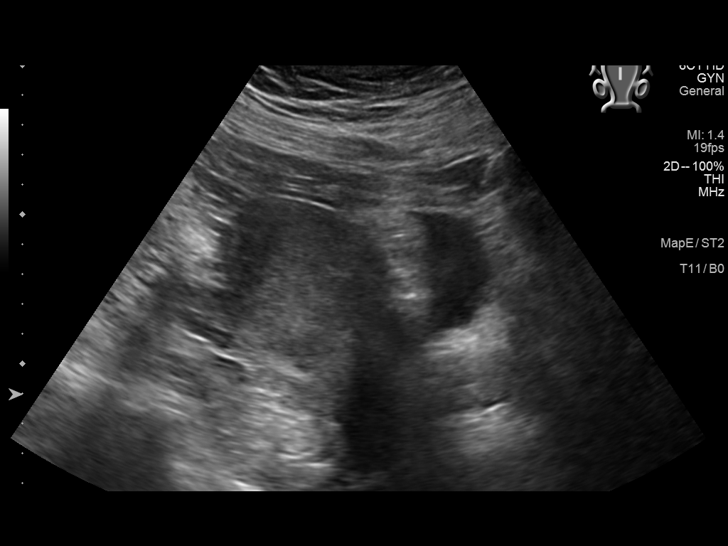
[im 6/70]
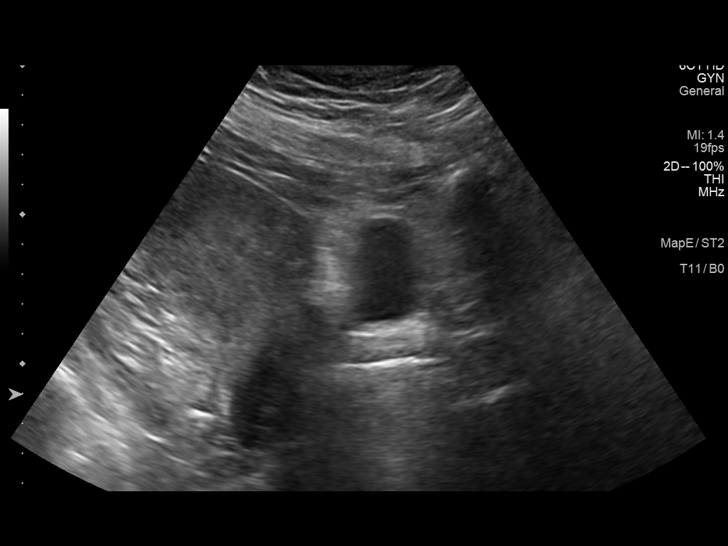
[im 12/70]
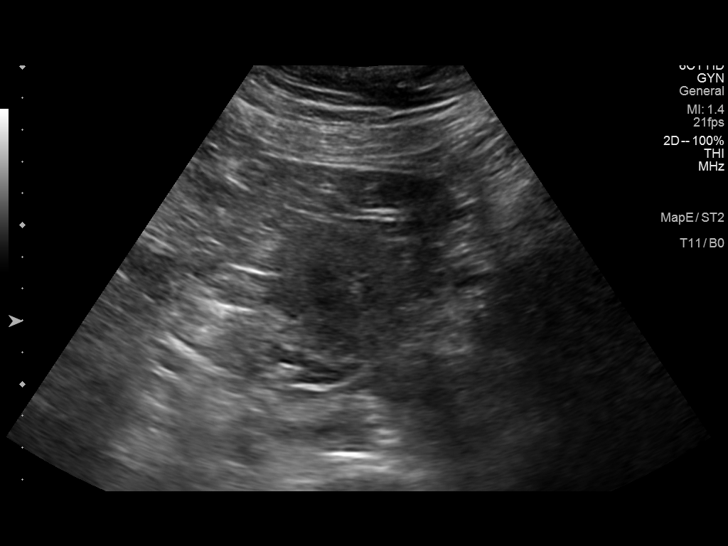
[im 18/70]
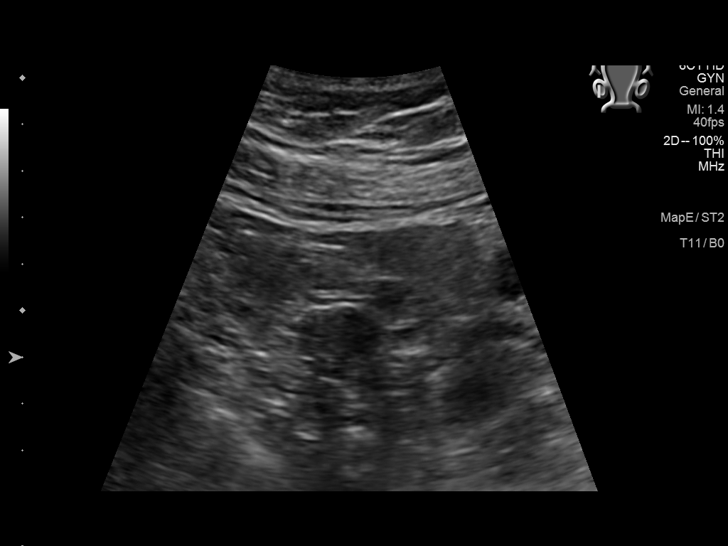
[im 24/70]
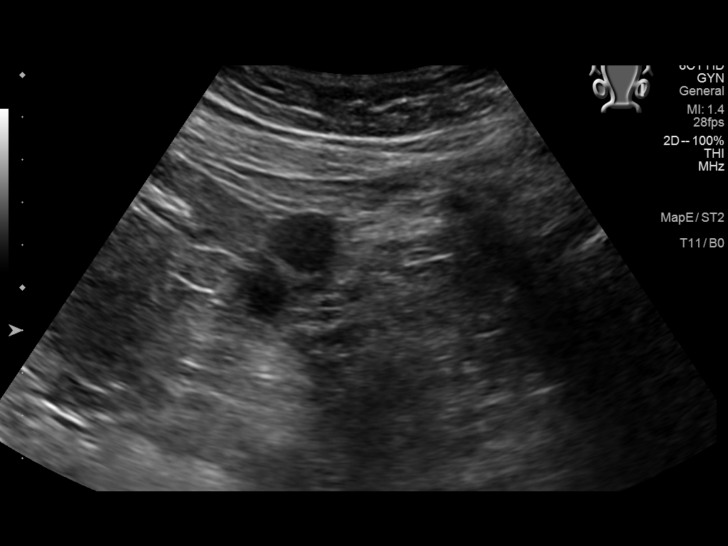
[im 29/70]
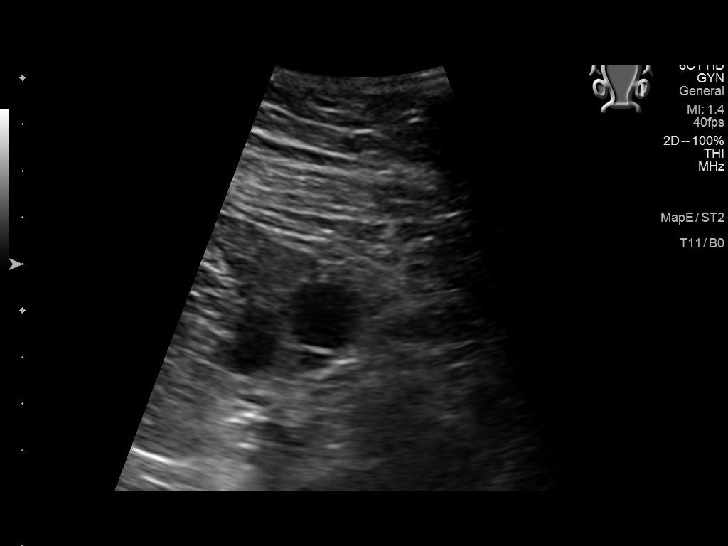
[im 35/70]
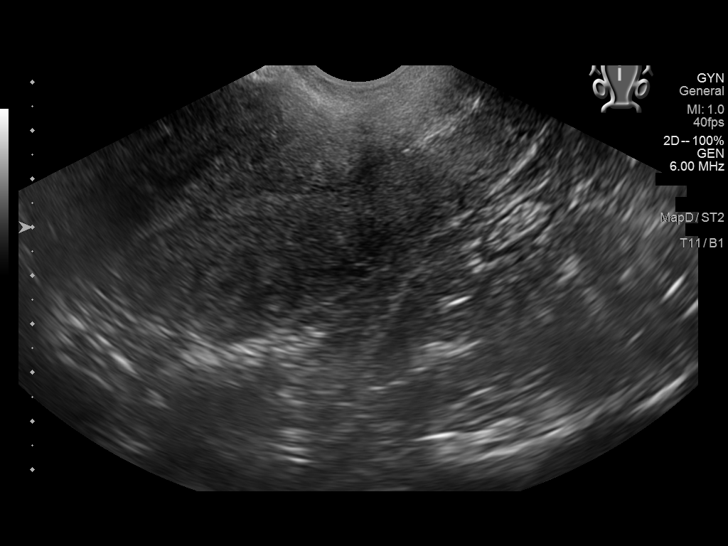
[im 41/70]
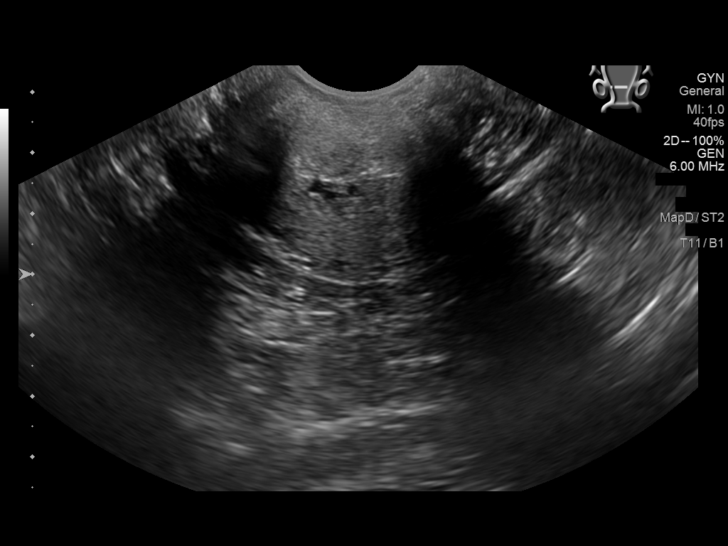
[im 47/70]
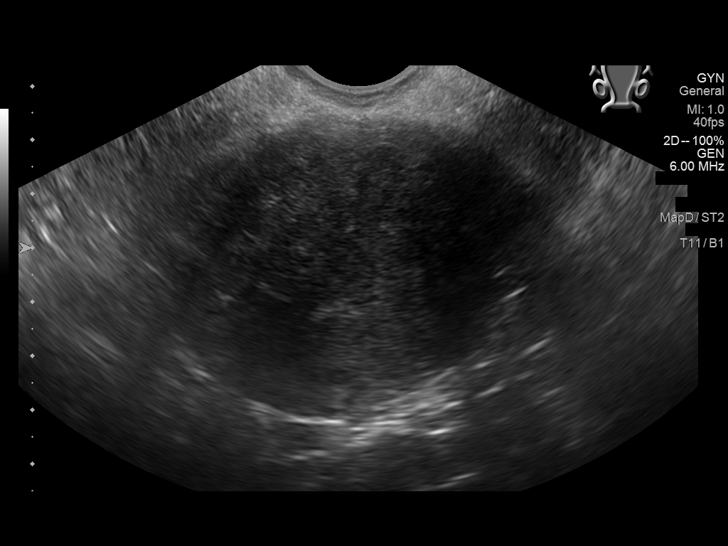
[im 52/70]
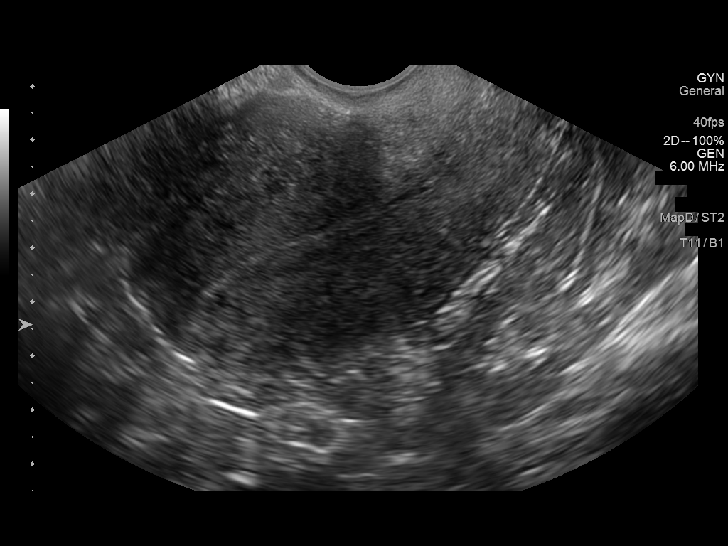
[im 58/70]
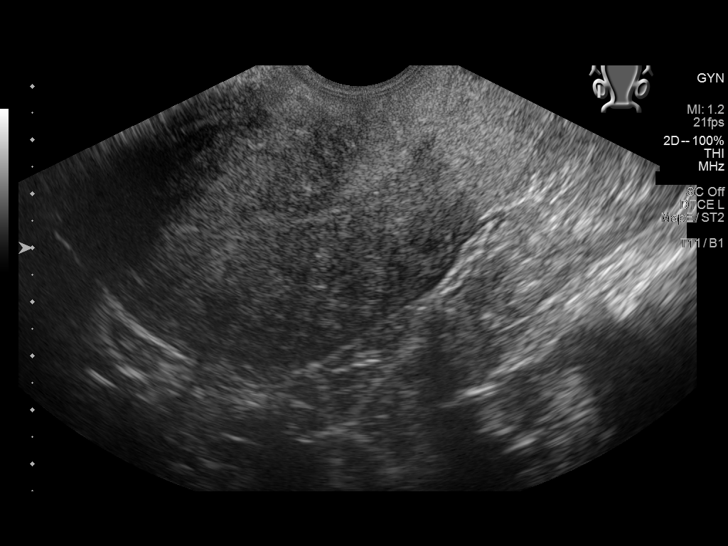
[im 64/70]
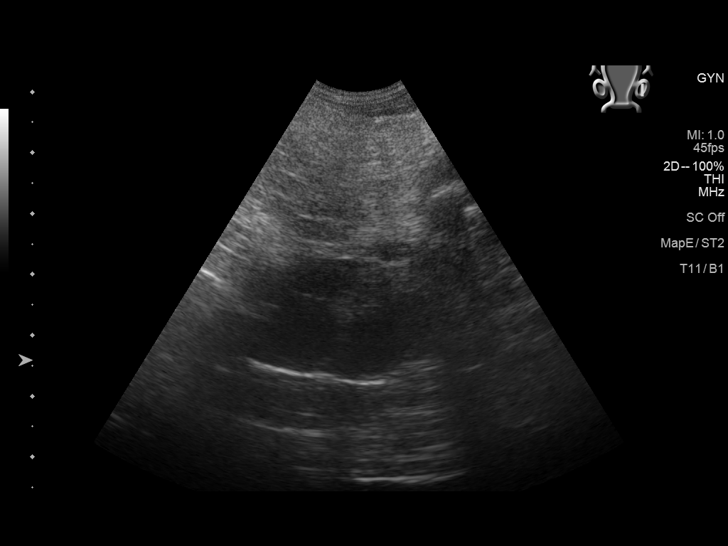
[im 70/70]
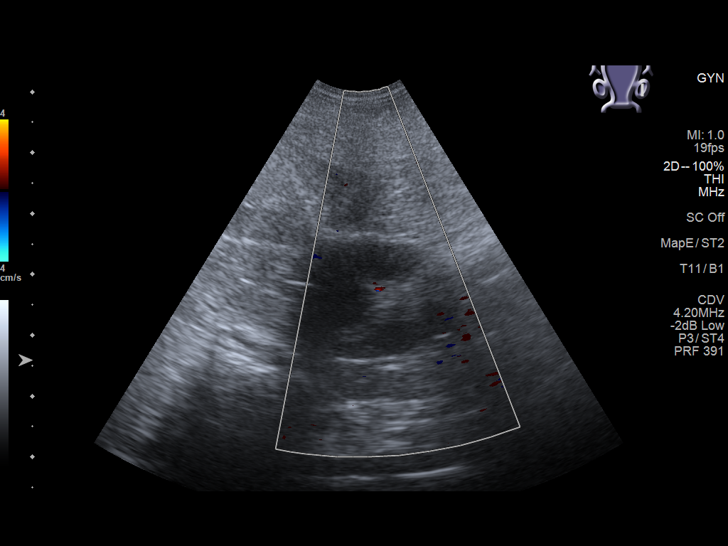

[13 of 25 positions shown; findings below may reference images not displayed]

FINDINGS: Uterus

Measurements: 10.1 x 5.4 x 6.2 cm = volume: 174 mL. Heterogeneous
myometrium. Scattered areas of shadowing. No focal mass

Endometrium

Thickness: 4 mm.  No endometrial fluid or focal abnormality

Right ovary

Measurements: 2.3 x 1.6 x 1.7 cm = volume: 3.4 mL. Normal morphology
without mass

Left ovary

Measurements: 3.4 x 1.5 x 1.8 cm = volume: 4.8 mL. Multiple
follicles without mass

Other findings

No free pelvic fluid or adnexal masses.
IMPRESSION: Diffusely heterogeneous myometrium and mild scattered areas of
shadowing, nonspecific but can be seen in patients with adenomyosis.

No discrete uterine mass identified.

Normal appearing endometrial complex and ovaries.

## 2022-04-25 ENCOUNTER — Encounter: Payer: Self-pay | Admitting: Hematology and Oncology

## 2022-05-02 ENCOUNTER — Encounter: Payer: Self-pay | Admitting: *Deleted

## 2022-05-05 ENCOUNTER — Encounter: Payer: Self-pay | Admitting: Anesthesiology

## 2022-05-05 ENCOUNTER — Other Ambulatory Visit: Payer: Self-pay

## 2022-05-05 ENCOUNTER — Ambulatory Visit: Payer: Managed Care, Other (non HMO) | Admitting: Anesthesiology

## 2022-05-05 ENCOUNTER — Encounter
Admission: RE | Disposition: A | Discharge status: Home / Self Care | Payer: Self-pay | Source: Home / Self Care | Attending: Gastroenterology

## 2022-05-05 ENCOUNTER — Ambulatory Visit
Admission: RE | Admit: 2022-05-05 | Discharge: 2022-05-05 | Disposition: A | Discharge status: Home / Self Care | Payer: Managed Care, Other (non HMO) | Attending: Gastroenterology | Admitting: Gastroenterology

## 2022-05-05 DIAGNOSIS — J45909 Unspecified asthma, uncomplicated: Secondary | ICD-10-CM | POA: Insufficient documentation

## 2022-05-05 DIAGNOSIS — K219 Gastro-esophageal reflux disease without esophagitis: Secondary | ICD-10-CM | POA: Insufficient documentation

## 2022-05-05 DIAGNOSIS — K640 First degree hemorrhoids: Secondary | ICD-10-CM | POA: Diagnosis not present

## 2022-05-05 DIAGNOSIS — Z8371 Family history of colonic polyps: Secondary | ICD-10-CM | POA: Diagnosis not present

## 2022-05-05 DIAGNOSIS — I10 Essential (primary) hypertension: Secondary | ICD-10-CM | POA: Insufficient documentation

## 2022-05-05 DIAGNOSIS — Z85828 Personal history of other malignant neoplasm of skin: Secondary | ICD-10-CM | POA: Insufficient documentation

## 2022-05-05 DIAGNOSIS — Z1211 Encounter for screening for malignant neoplasm of colon: Secondary | ICD-10-CM | POA: Diagnosis not present

## 2022-05-05 DIAGNOSIS — K5730 Diverticulosis of large intestine without perforation or abscess without bleeding: Secondary | ICD-10-CM | POA: Diagnosis not present

## 2022-05-05 DIAGNOSIS — Z6841 Body Mass Index (BMI) 40.0 and over, adult: Secondary | ICD-10-CM | POA: Diagnosis not present

## 2022-05-05 HISTORY — PX: COLONOSCOPY WITH PROPOFOL: SHX5780

## 2022-05-05 LAB — POCT PREGNANCY, URINE: Preg Test, Ur: NEGATIVE

## 2022-05-05 SURGERY — COLONOSCOPY WITH PROPOFOL
Anesthesia: General

## 2022-05-05 MED ORDER — PROPOFOL 500 MG/50ML IV EMUL
INTRAVENOUS | Status: DC | PRN
  Administered 2022-05-05: 150 ug/kg/min via INTRAVENOUS

## 2022-05-05 MED ORDER — PROPOFOL 10 MG/ML IV BOLUS
INTRAVENOUS | Status: AC
  Filled 2022-05-05: qty 20

## 2022-05-05 MED ORDER — SODIUM CHLORIDE 0.9 % IV SOLN: INTRAVENOUS | Status: DC

## 2022-05-05 NOTE — Anesthesia Preprocedure Evaluation (Signed)
Anesthesia Evaluation  Patient identified by MRN, date of birth, ID band Patient awake    Reviewed: Allergy & Precautions, NPO status , Patient's Chart, lab work & pertinent test results  History of Anesthesia Complications Negative for: history of anesthetic complications  Airway Mallampati: III  TM Distance: <3 FB Neck ROM: full    Dental  (+) Chipped   Pulmonary neg shortness of breath, asthma , former smoker,    Pulmonary exam normal        Cardiovascular Exercise Tolerance: Good hypertension, (-) angina(-) Past MI Normal cardiovascular exam     Neuro/Psych  Headaches, negative psych ROS   GI/Hepatic Neg liver ROS, GERD  Controlled,  Endo/Other  Morbid obesity  Renal/GU negative Renal ROS  negative genitourinary   Musculoskeletal   Abdominal   Peds  Hematology negative hematology ROS (+)   Anesthesia Other Findings Past Medical History: No date: Asthma No date: Cancer ([place])     Comment:  skin cancer, basal cell No date: GERD (gastroesophageal reflux disease) No date: HTN (hypertension)  Past Surgical History: 01/2001: ADENOIDECTOMY 01/06/2008: BASAL CELL CARCINOMA EXCISION 11/24/2016: COLONOSCOPY WITH PROPOFOL; N/A     Comment:  Procedure: COLONOSCOPY WITH PROPOFOL;  Surgeon: Lollie Sails, MD;  Location: [facility] ENDOSCOPY;  Service:               Endoscopy;  Laterality: N/A; No date: POLYPECTOMY     Comment:  benign 1980: TONSILLECTOMY  BMI    Body Mass Index: 40.10 kg/m      Reproductive/Obstetrics negative OB ROS                             Anesthesia Physical Anesthesia Plan  ASA: 3  Anesthesia Plan: General   Post-op Pain Management:    Induction: Intravenous  PONV Risk Score and Plan: Propofol infusion and TIVA  Airway Management Planned: Natural Airway and Nasal Cannula  Additional Equipment:   Intra-op Plan:   Post-operative Plan:    Informed Consent: I have reviewed the patients History and Physical, chart, labs and discussed the procedure including the risks, benefits and alternatives for the proposed anesthesia with the patient or authorized representative who has indicated his/her understanding and acceptance.     Dental Advisory Given  Plan Discussed with: Anesthesiologist, CRNA and Surgeon  Anesthesia Plan Comments: (Patient consented for risks of anesthesia including but not limited to:  - adverse reactions to medications - risk of airway placement if required - damage to eyes, teeth, lips or other oral mucosa - nerve damage due to positioning  - sore throat or hoarseness - Damage to heart, brain, nerves, lungs, other parts of body or loss of life  Patient voiced understanding.)        Anesthesia Quick Evaluation

## 2022-05-05 NOTE — Transfer of Care (Signed)
Immediate Anesthesia Transfer of Care Note  Patient: Heather Bauer  Procedure(s) Performed: COLONOSCOPY WITH PROPOFOL  Patient Location: PACU  Anesthesia Type:General  Level of Consciousness: awake and sedated  Airway & Oxygen Therapy: Patient Spontanous Breathing and Patient connected to nasal cannula oxygen  Post-op Assessment: Report given to RN and Post -op Vital signs reviewed and stable  Post vital signs: Reviewed and stable  Last Vitals:  Vitals Value Taken Time  BP    Temp    Pulse    Resp    SpO2      Last Pain:  Vitals:   05/05/22 1232  TempSrc: Temporal  PainSc: 0-No pain         Complications: No notable events documented.

## 2022-05-05 NOTE — Op Note (Signed)
[facility] Patient Name: Heather Bauer Procedure Date: 05/05/2022 1:38 PM MRN: [id number] Account #: [id number] Date of Birth: 01-27-1966 Admit Type: Outpatient Age: 56 Room: [facility] ENDO ROOM 3 Gender: Female Note Status: Finalized Instrument Name: Jasper Riling [id number] Procedure:             Colonoscopy Indications:           Colon cancer screening in patient at increased risk:                         Family history of 1st-degree relative with colon polyps Providers:             Andrey Farmer MD, MD Referring MD:          Andrey Farmer MD, MD (Referring MD), Ane Payment, MD (Referring MD) Medicines:             Monitored Anesthesia Care Complications:         No immediate complications. Estimated blood loss:                         Minimal. Procedure:             Pre-Anesthesia Assessment:                        - Prior to the procedure, a History and Physical was                         performed, and patient medications and allergies were                         reviewed. The patient is competent. The risks and                         benefits of the procedure and the sedation options and                         risks were discussed with the patient. All questions                         were answered and informed consent was obtained.                         Patient identification and proposed procedure were                         verified by the physician, the nurse, the                         anesthesiologist, the anesthetist and the technician                         in the endoscopy suite. Mental Status Examination:                         alert and oriented. Airway Examination: normal  oropharyngeal airway and neck mobility. Respiratory                         Examination: clear to auscultation. CV Examination:                         normal. Prophylactic Antibiotics: The patient  does not                         require prophylactic antibiotics. Prior                         Anticoagulants: The patient has taken no previous                         anticoagulant or antiplatelet agents. ASA Grade                         Assessment: II - A patient with mild systemic disease.                         After reviewing the risks and benefits, the patient                         was deemed in satisfactory condition to undergo the                         procedure. The anesthesia plan was to use monitored                         anesthesia care (MAC). Immediately prior to                         administration of medications, the patient was                         re-assessed for adequacy to receive sedatives. The                         heart rate, respiratory rate, oxygen saturations,                         blood pressure, adequacy of pulmonary ventilation, and                         response to care were monitored throughout the                         procedure. The physical status of the patient was                         re-assessed after the procedure.                        After obtaining informed consent, the colonoscope was                         passed under direct vision. Throughout the procedure,  the patient's blood pressure, pulse, and oxygen                         saturations were monitored continuously. The                         Colonoscope was introduced through the anus and                         advanced to the the cecum, identified by appendiceal                         orifice and ileocecal valve. The colonoscopy was                         performed without difficulty. The patient tolerated                         the procedure well. The quality of the bowel                         preparation was good. Findings:      The perianal and digital rectal examinations were normal.      A 3 mm polyp was found in the ileocecal  valve. The polyp was sessile.       The polyp was removed with a cold snare. Resection and retrieval were       complete. Estimated blood loss was minimal.      A 2 mm polyp was found in the rectum. The polyp was sessile. The polyp       was removed with a cold snare. Resection and retrieval were complete.       Estimated blood loss was minimal.      A single small-mouthed diverticulum was found in the transverse colon.      Internal hemorrhoids were found during retroflexion. The hemorrhoids       were Grade I (internal hemorrhoids that do not prolapse).      The exam was otherwise without abnormality on direct and retroflexion       views. Impression:            - One 3 mm polyp at the ileocecal valve, removed with                         a cold snare. Resected and retrieved.                        - One 2 mm polyp in the rectum, removed with a cold                         snare. Resected and retrieved.                        - Diverticulosis in the transverse colon.                        - Internal hemorrhoids.                        - The examination was otherwise normal on direct and  retroflexion views. Recommendation:        - Discharge patient to home.                        - Resume previous diet.                        - Continue present medications.                        - Await pathology results.                        - Repeat colonoscopy for surveillance based on                         pathology results.                        - Return to referring physician as previously                         scheduled. Procedure Code(s):     --- Professional ---                        [phone], Colonoscopy, flexible; with removal of                         tumor(s), polyp(s), or other lesion(s) by snare                         technique Diagnosis Code(s):     --- Professional ---                        Z83.71, Family history of colonic polyps                         K63.5, Polyp of colon                        K62.1, Rectal polyp                        K64.0, First degree hemorrhoids                        K57.30, Diverticulosis of large intestine without                         perforation or abscess without bleeding CPT copyright 2019 [employer]. All rights reserved. The codes documented in this report are preliminary and upon coder review may  be revised to meet current compliance requirements. Andrey Farmer MD, MD 05/05/2022 2:08:39 PM Number of Addenda: 0 Note Initiated On: 05/05/2022 1:38 PM Scope Withdrawal Time: 0 hours 9 minutes 9 seconds  Total Procedure Duration: 0 hours 15 minutes 11 seconds  Estimated Blood Loss:  Estimated blood loss was minimal.      [facility]

## 2022-05-05 NOTE — Anesthesia Procedure Notes (Signed)
Date/Time: 05/05/2022 1:45 PM  Performed by: Donalda Ewings, CindyPre-anesthesia Checklist: Patient identified, Emergency Drugs available, Suction available, Patient being monitored and Timeout performed Patient Re-evaluated:Patient Re-evaluated prior to induction Oxygen Delivery Method: Simple face mask Preoxygenation: Pre-oxygenation with 100% oxygen Induction Type: IV induction Placement Confirmation: positive ETCO2 and CO2 detector

## 2022-05-06 ENCOUNTER — Encounter: Payer: Self-pay | Admitting: Gastroenterology

## 2022-05-06 NOTE — Anesthesia Postprocedure Evaluation (Signed)
Anesthesia Post Note  Patient: Heather Bauer  Procedure(s) Performed: COLONOSCOPY WITH PROPOFOL  Patient location during evaluation: Endoscopy Anesthesia Type: General Level of consciousness: awake and alert Pain management: pain level controlled Vital Signs Assessment: post-procedure vital signs reviewed and stable Respiratory status: spontaneous breathing, nonlabored ventilation, respiratory function stable and patient connected to nasal cannula oxygen Cardiovascular status: blood pressure returned to baseline and stable Postop Assessment: no apparent nausea or vomiting Anesthetic complications: no   No notable events documented.   Last Vitals:  Vitals:   05/05/22 1413 05/05/22 1423  BP: (!) 161/98 (!) 163/100  Pulse: 66 (!) 57  Resp: 18 20  Temp:    SpO2: 99% 100%    Last Pain:  Vitals:   05/06/22 0755  TempSrc:   PainSc: 0-No pain                 Martha Clan

## 2022-05-07 LAB — SURGICAL PATHOLOGY

## 2022-05-13 NOTE — H&P (Signed)
Outpatient short stay form Pre-procedure 05/13/2022  Lesly Rubenstein, MD  Primary Physician: Barbaraann Boys, MD  Reason for visit:  Family history of polyps  History of present illness:    56 y/o lady with family history of polyps here for screening colonoscopy. No blood thinners.   No current facility-administered medications for this encounter.  Current Outpatient Medications:    propranolol (INDERAL) 40 MG tablet, TAKE 1 TABLET BY MOUTH EVERY 12 HOURS, Disp: 60 tablet, Rfl: 0   chlorzoxazone (PARAFON) 500 MG tablet, TAKE ONE-HALF TABLET BY MOUTH TWICE DAILY AS NEEDED FOR MUSCLE SPASMS FOR UP TO 30 DAYS, Disp: , Rfl:    cyclobenzaprine (FLEXERIL) 10 MG tablet, Take 10 mg by mouth 3 (three) times daily as needed.  (Patient not taking: Reported on 06/04/2020), Disp: , Rfl:    dexamethasone (DECADRON) 2 MG tablet, Take 5 tablets (10 mg total) by mouth 2 (two) times daily. (5 tabs)10 mg bid for 3 days and then  5 tabs)10 mg qd for 3 days then stop (Patient not taking: Reported on 06/04/2020), Disp: 45 tablet, Rfl: 0   ketorolac (TORADOL) 10 MG tablet, Take 1 tablet (10 mg total) by mouth every 8 (eight) hours as needed for severe pain. (Patient not taking: Reported on 06/04/2020), Disp: 15 tablet, Rfl: 0   loratadine (CLARITIN) 10 MG tablet, Take 1 tablet by mouth daily as needed.  (Patient not taking: Reported on 06/04/2020), Disp: , Rfl:    Multiple Vitamins-Minerals (MULTIVITAMIN GUMMIES WOMENS PO), Take 2 Doses by mouth daily. (Patient not taking: Reported on 06/04/2020), Disp: , Rfl:    NAPROXEN DR 500 MG EC tablet, , Disp: , Rfl:    pantoprazole (PROTONIX) 40 MG tablet, Take 1 tablet (40 mg total) by mouth daily. (Patient not taking: Reported on 06/04/2020), Disp: 7 tablet, Rfl: 0  No medications prior to admission.     Allergies  Allergen Reactions   Latex Itching    Latex gloves, bandaids, tape   Methocarbamol Hives   Sulfa Antibiotics     Fever   Tape Other (See Comments)     Latex tape     Past Medical History:  Diagnosis Date   Asthma    Cancer ([place])    skin cancer, basal cell   GERD (gastroesophageal reflux disease)    HTN (hypertension)     Review of systems:  Otherwise negative.    Physical Exam  Gen: Alert, oriented. Appears stated age.  HEENT:PERRLA. Lungs: No respiratory distress CV: RRR Abd: soft, benign, no masses Ext: No edema    Planned procedures: Proceed with colonoscopy. The patient understands the nature of the planned procedure, indications, risks, alternatives and potential complications including but not limited to bleeding, infection, perforation, damage to internal organs and possible oversedation/side effects from anesthesia. The patient agrees and gives consent to proceed.  Please refer to procedure notes for findings, recommendations and patient disposition/instructions.     Lesly Rubenstein, MD [facility]

## 2022-07-05 IMAGING — MR MR HIP*R* W/O CM
4 of 5 series · 26 of 40 positions shown · non-contrast
Comparison: None.

CLINICAL DATA: Stretching injury 1 and half years ago. Persistent
right hip pain.

EXAM:
MR OF THE RIGHT HIP WITHOUT CONTRAST
TECHNIQUE: Multiplanar, multisequence MR imaging was performed. No intravenous
contrast was administered.

[Series 2: T1 · coronal · right · 4.0mm · 0.59mm/px · 5 of 38 slices shown]
[im 1/38]
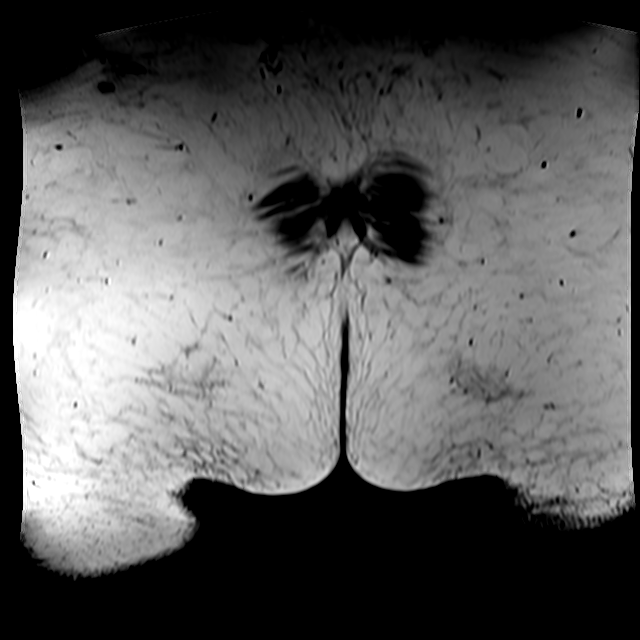
[im 5/38]
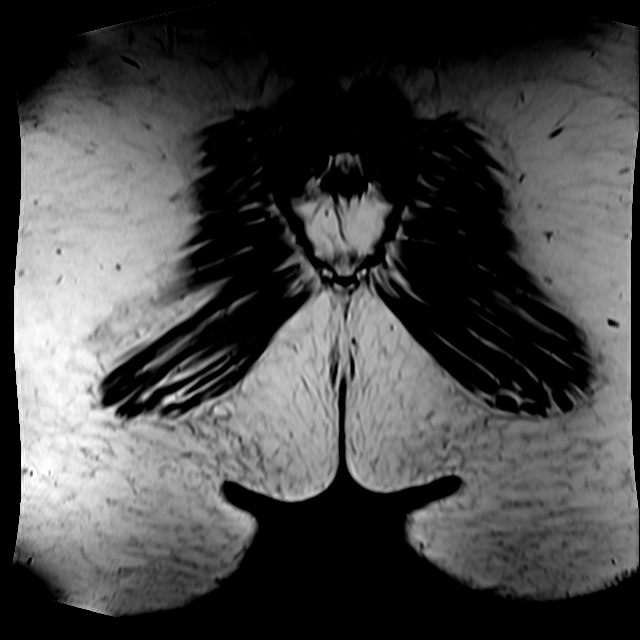
[im 13/38]
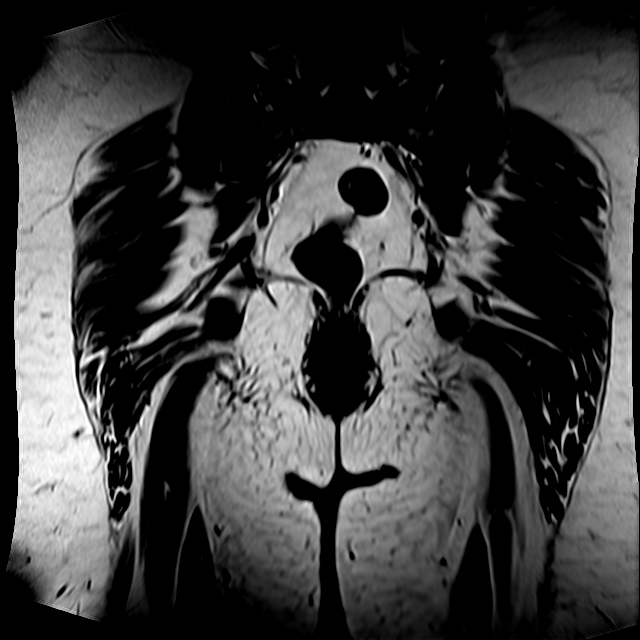
[im 21/38]
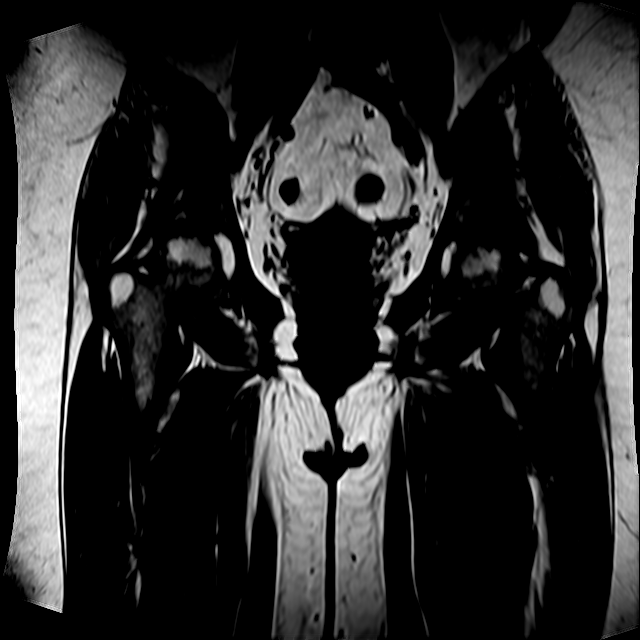
[im 33/38]
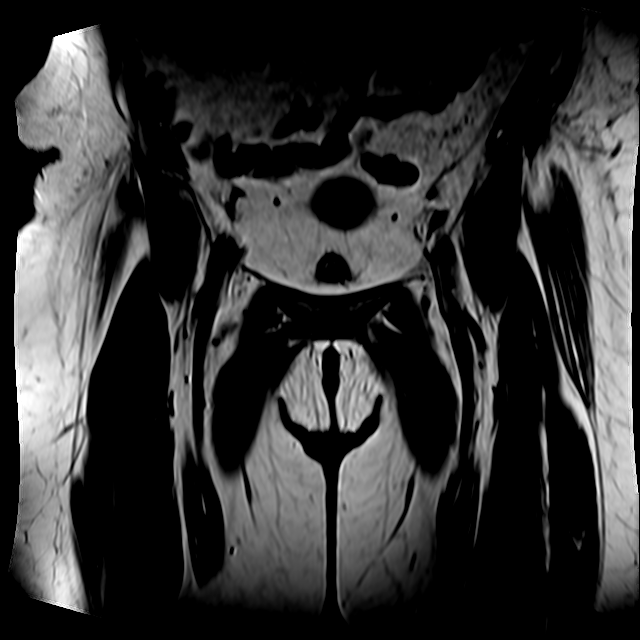

[Series 4: T2 fat-sat · axial · right · 4.0mm · 0.35mm/px · z∈[-66,+79]mm · 7 of 30 slices shown]
[im 1/30]
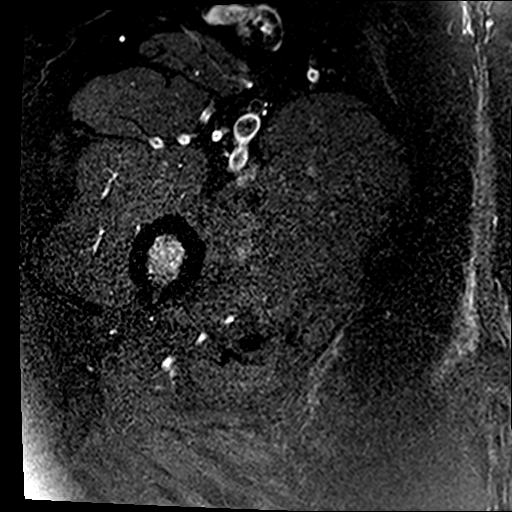
[im 5/30]
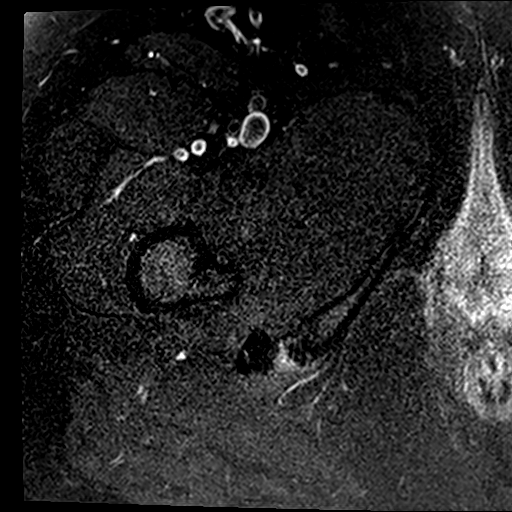
[im 10/30]
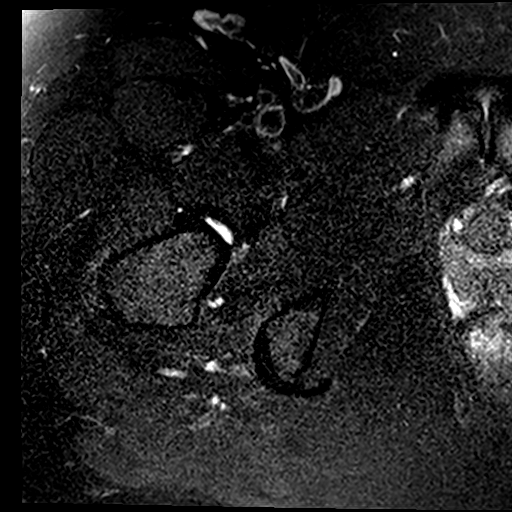
[im 15/30]
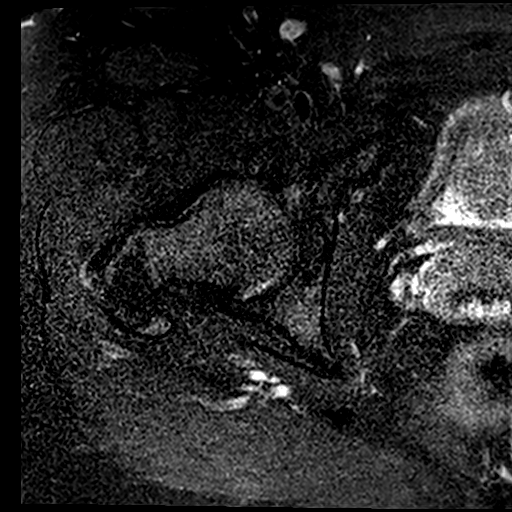
[im 20/30]
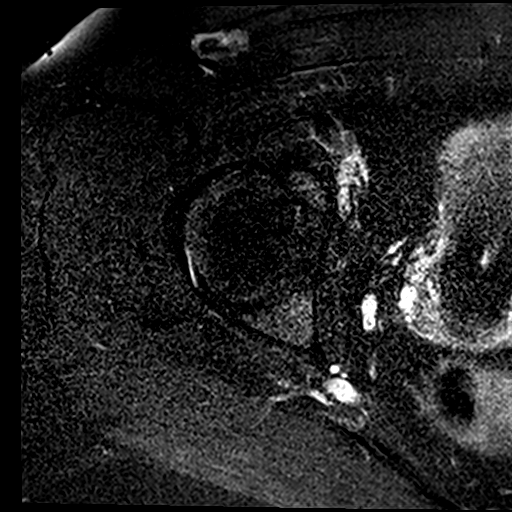
[im 25/30]
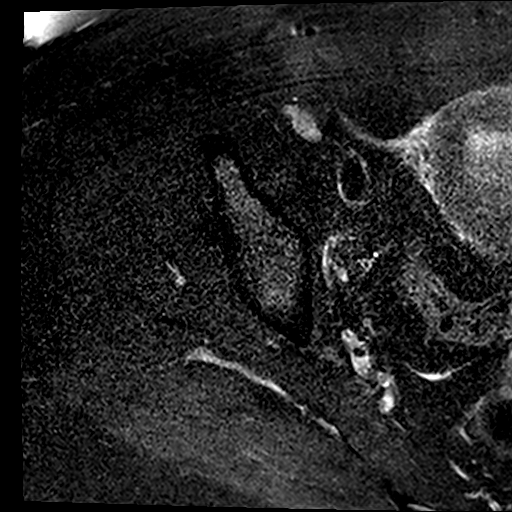
[im 30/30]
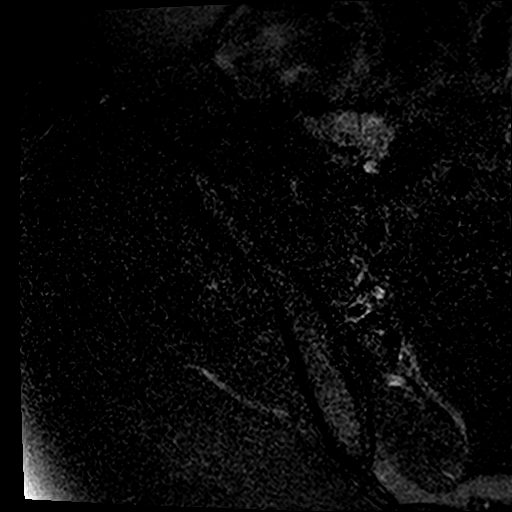

[Series 5: PD fat-sat · sagittal · right · 4.0mm · 0.70mm/px · 7 of 29 slices shown (1 of 2)]
[im 1/29]
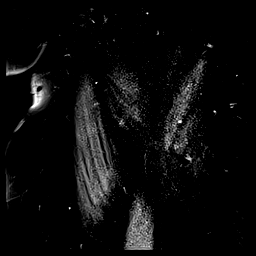
[im 5/29]
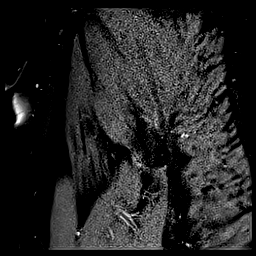
[im 10/29]
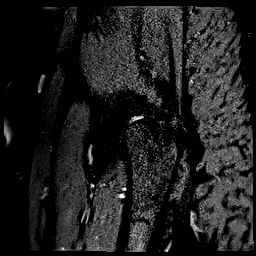
[im 15/29]
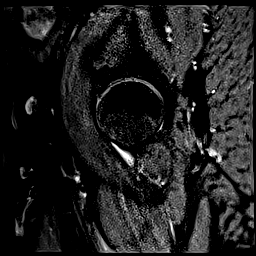
[im 19/29]
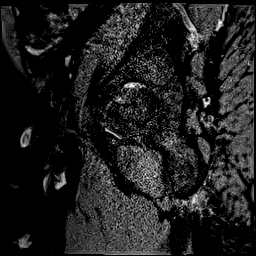
[im 24/29]
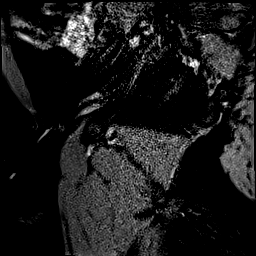
[im 29/29]
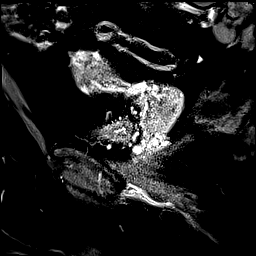

[Series 6: PD fat-sat · coronal · right · 4.0mm · 0.70mm/px · 7 of 29 slices shown (2 of 2)]
[im 1/29]
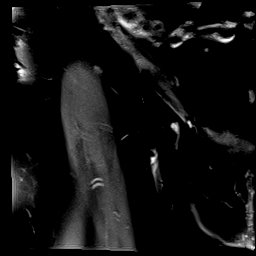
[im 5/29]
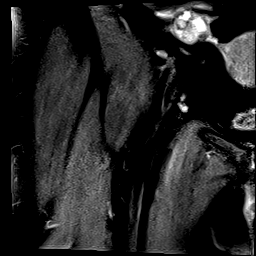
[im 10/29]
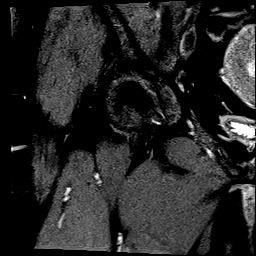
[im 15/29]
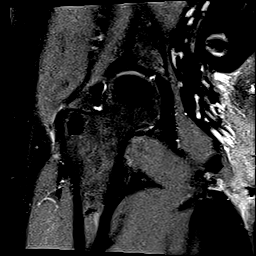
[im 19/29]
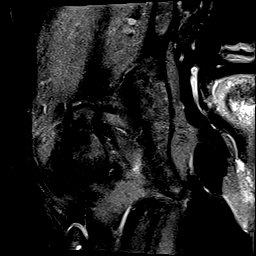
[im 24/29]
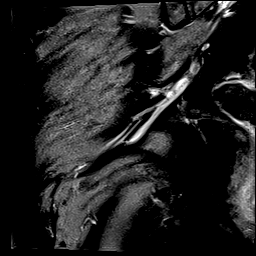
[im 29/29]
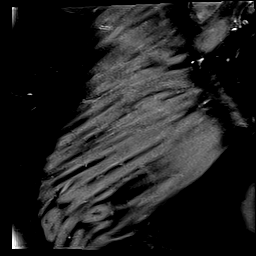

[26 of 40 positions shown; findings below may reference images not displayed]

FINDINGS: Both hips are normally located. No stress fracture or AVN. Mild
bilateral hip joint degenerative changes are noted with areas of
degenerative chondrosis, early joint space narrowing and early
spurring. No hip joint effusion. No periarticular fluid collections
to suggest a paralabral cyst. No obvious labral tear is identified.

The pubic symphysis and SI joints are intact. No pelvic fractures or
bone lesions. The hip and pelvic musculature appear unremarkable. No
muscle tear or myositis. Mild bilateral peritendinosis but no
findings for trochanteric bursitis. The hamstring tendons are
intact. Mild tendinopathy on the right side.

No significant intrapelvic abnormalities are identified.
IMPRESSION: 1. Mild bilateral hip joint degenerative changes but no stress
fracture or AVN.
2. No obvious labral tear.
3. Mild bilateral peritendinosis but no findings for trochanteric
bursitis.
4. No significant intrapelvic abnormalities.

## 2024-03-24 DIAGNOSIS — R87610 Atypical squamous cells of undetermined significance on cytologic smear of cervix (ASC-US): Secondary | ICD-10-CM | POA: Diagnosis not present

## 2024-03-24 DIAGNOSIS — Z01419 Encounter for gynecological examination (general) (routine) without abnormal findings: Secondary | ICD-10-CM | POA: Diagnosis not present

## 2024-03-24 DIAGNOSIS — Z1331 Encounter for screening for depression: Secondary | ICD-10-CM | POA: Diagnosis not present

## 2024-04-21 DIAGNOSIS — M79671 Pain in right foot: Secondary | ICD-10-CM | POA: Diagnosis not present

## 2024-07-04 DIAGNOSIS — Z0000 Encounter for general adult medical examination without abnormal findings: Secondary | ICD-10-CM | POA: Diagnosis not present

## 2024-07-04 DIAGNOSIS — I10 Essential (primary) hypertension: Secondary | ICD-10-CM | POA: Diagnosis not present

## 2024-07-04 DIAGNOSIS — E282 Polycystic ovarian syndrome: Secondary | ICD-10-CM | POA: Diagnosis not present

## 2024-07-04 DIAGNOSIS — R7303 Prediabetes: Secondary | ICD-10-CM | POA: Diagnosis not present

## 2024-07-04 DIAGNOSIS — R87610 Atypical squamous cells of undetermined significance on cytologic smear of cervix (ASC-US): Secondary | ICD-10-CM | POA: Diagnosis not present

## 2024-07-04 DIAGNOSIS — I83813 Varicose veins of bilateral lower extremities with pain: Secondary | ICD-10-CM | POA: Diagnosis not present

## 2024-07-04 DIAGNOSIS — D500 Iron deficiency anemia secondary to blood loss (chronic): Secondary | ICD-10-CM | POA: Diagnosis not present

## 2024-07-04 DIAGNOSIS — Z1331 Encounter for screening for depression: Secondary | ICD-10-CM | POA: Diagnosis not present

## 2024-07-04 DIAGNOSIS — R635 Abnormal weight gain: Secondary | ICD-10-CM | POA: Diagnosis not present

## 2024-07-04 DIAGNOSIS — E66811 Obesity, class 1: Secondary | ICD-10-CM | POA: Diagnosis not present

## 2024-07-04 DIAGNOSIS — Z1330 Encounter for screening examination for mental health and behavioral disorders, unspecified: Secondary | ICD-10-CM | POA: Diagnosis not present

## 2024-07-04 DIAGNOSIS — E559 Vitamin D deficiency, unspecified: Secondary | ICD-10-CM | POA: Diagnosis not present

## 2024-07-04 DIAGNOSIS — E538 Deficiency of other specified B group vitamins: Secondary | ICD-10-CM | POA: Diagnosis not present

## 2024-07-04 DIAGNOSIS — M545 Low back pain, unspecified: Secondary | ICD-10-CM | POA: Diagnosis not present

## 2024-11-10 ENCOUNTER — Encounter: Payer: Self-pay | Admitting: Hematology and Oncology

## 2024-11-10 ENCOUNTER — Encounter: Payer: Self-pay | Admitting: Emergency Medicine

## 2024-11-10 ENCOUNTER — Emergency Department
Admission: EM | Admit: 2024-11-10 | Discharge: 2024-11-10 | Disposition: A | Discharge status: Home / Self Care | Source: Home / Self Care

## 2024-11-10 ENCOUNTER — Other Ambulatory Visit: Payer: Self-pay

## 2024-11-10 DIAGNOSIS — M25561 Pain in right knee: Secondary | ICD-10-CM

## 2024-11-10 DIAGNOSIS — S83411A Sprain of medial collateral ligament of right knee, initial encounter: Secondary | ICD-10-CM

## 2024-11-10 MED ORDER — CYCLOBENZAPRINE HCL 5 MG PO TABS: 5.0000 mg | ORAL_TABLET | Freq: Three times a day (TID) | ORAL | 0 refills | Status: AC | PRN

## 2024-11-10 MED ORDER — HYDROCODONE-ACETAMINOPHEN 5-325 MG PO TABS
1.0000 | ORAL_TABLET | Freq: Once | ORAL | Status: AC
  Administered 2024-11-10: 1 via ORAL
  Filled 2024-11-10: qty 1

## 2024-11-10 MED ORDER — ONDANSETRON 4 MG PO TBDP
4.0000 mg | ORAL_TABLET | Freq: Once | ORAL | Status: AC
  Administered 2024-11-10: 4 mg via ORAL
  Filled 2024-11-10: qty 1

## 2024-11-10 MED ORDER — LIDOCAINE 5 % EX PTCH
1.0000 | MEDICATED_PATCH | Freq: Once | CUTANEOUS | Status: DC
  Administered 2024-11-10: 1 via TRANSDERMAL
  Filled 2024-11-10: qty 1

## 2024-11-10 MED ORDER — HYDROCODONE-ACETAMINOPHEN 5-325 MG PO TABS: 1.0000 | ORAL_TABLET | Freq: Three times a day (TID) | ORAL | 0 refills | Status: AC | PRN

## 2024-11-10 NOTE — ED Notes (Signed)
 Wrap and knee immob in place per APP. Crutches provided.

## 2024-11-10 NOTE — ED Notes (Signed)
 Pt in with co right knee pain states fell a few days ago, saw pmd and xray was negative. Was told to [facility] up with [facility] for [facility] in 7 days but pt states she cannot tolerate pain. Ace wrap note to area, unable to bear weight.

## 2024-11-10 NOTE — ED Triage Notes (Addendum)
 Pt via POV from home. Pt had a fall onto her bilateral knees 1/27 and was seen at [facility] the Monday afterwards, XR done and denies fx. Was told it was a sprain. Movement makes it worse. States that her R knee isnt getting any better. Pt is A&Ox4 and NAD   Pt does not want another XR was told that she would need an MRI at [facility].

## 2024-11-10 NOTE — ED Provider Notes (Signed)
 "   [facility] Emergency Department Provider Note     Event Date/Time   First MD Initiated Contact with Patient 11/10/24 1243     (approximate)   History   Knee Pain   HPI  RIN GORTON is a 59 y.o. female with a history of HTN, asthma, and GERD, who presents to the ED for evaluation of right knee pain following mechanical fall.  Patient fell onto her knees on 27 January due to a slip and fall on ice.  She was subsequently evaluated at a local urgent care, 1 week later.  X-rays were read as negative, patient was referred to orthopedics for further follow-up.  She presents to the ED endorsing ongoing pain to the right knee.  Patient has declined x-ray at this time.  She was advised at the urgent care that she might need an MRI at some point.  Patient denies any reinjury to the knee joint.  She is only been taking OTC Tylenol  Motrin for pain.  She has been unable to tolerate the neoprene sleeve due to the extreme pain at the medial knee.   Physical Exam   Triage Vital Signs: ED Triage Vitals  Encounter Vitals Group     BP 11/10/24 1223 (!) 154/114     Girls Systolic BP Percentile --      Girls Diastolic BP Percentile --      Boys Systolic BP Percentile --      Boys Diastolic BP Percentile --      Pulse Rate 11/10/24 1223 72     Resp 11/10/24 1223 18     Temp 11/10/24 1223 98.4 F (36.9 C)     Temp Source 11/10/24 1223 Oral     SpO2 11/10/24 1223 98 %     Weight 11/10/24 1221 200 lb (90.7 kg)     Height 11/10/24 1221 5' 7 (1.702 m)     Head Circumference --      Peak Flow --      Pain Score 11/10/24 1220 4     Pain Loc --      Pain Education --      Exclude from Growth Chart --     Most recent vital signs: Vitals:   11/10/24 1223  BP: (!) 154/114  Pulse: 72  Resp: 18  Temp: 98.4 F (36.9 C)  SpO2: 98%    General Awake, no distress. NAD HEENT NCAT. PERRL. EOMI. No rhinorrhea. Mucous membranes are moist.  CV:  Good peripheral perfusion.  No CCE distally RESP:  Normal effort.  MSK:  Right knee without obvious deformity or dislocation.  Minimal joint effusion noted.  Patient with anterior patella bruising noted.  Tender to palpation to the medial femoral condyle.  No valgus or varus strain stress elicited.  No popliteal fullness is noted.  No calf or Achilles tenderness noted distally.   ED Results / Procedures / Treatments   Labs (all labs ordered are listed, but only abnormal results are displayed) Labs Reviewed - No data to display   EKG   RADIOLOGY  No results found.   PROCEDURES:  Critical Care performed: No  Procedures   MEDICATIONS ORDERED IN ED: Medications  HYDROcodone -acetaminophen  (NORCO/VICODIN) 5-325 MG per tablet 1 tablet (1 tablet Oral Given 11/10/24 1343)  ondansetron  (ZOFRAN -ODT) disintegrating tablet 4 mg (4 mg Oral Given 11/10/24 1343)     IMPRESSION / MDM / ASSESSMENT AND PLAN / ED COURSE  I reviewed the triage vital signs and  the nursing notes.                              Differential diagnosis includes, but is not limited to, sprain, strain, fracture, bursitis, tendinitis  Patient's presentation is most consistent with acute, uncomplicated illness.  Patient's diagnosis is consistent with acute knee pain on the right, secondary to sprain and contusion.  Patient's clinical exam is overall reassuring.  No evidence of significant derangement to the knee however exam is limited by patient's subjective pain to the medial femoral condyle.  No valgus or varus joint stresses elicited on exam.  Patient will be placed in a Jones Watson wrap, and given a knee immobilizer for support.  Lidoderm  patch is placed to the medial knee to help with local pain.  I discussed the patient's options for her including referral to local orthopedic Center for further evaluation.  I advised her there was no indication for an emergent MRI based on her presentation.  Further advised that medial collateral sprain without  evidence of further derangement likely would not require any surgical intervention and would likely heal with conservative management.  Patient will be discharged home with prescriptions for cyclobenzaprine  and hydrocodone . Patient is to follow up with an Ortho provider approved by her insurance company as discussed, as needed or otherwise directed. Patient is given ED precautions to return to the ED for any worsening or new symptoms.   FINAL CLINICAL IMPRESSION(S) / ED DIAGNOSES   Final diagnoses:  Acute pain of right knee  Sprain of medial collateral ligament of right knee, initial encounter     Rx / DC Orders   ED Discharge Orders          Ordered    cyclobenzaprine  (FLEXERIL ) 5 MG tablet  3 times daily PRN        11/10/24 1336    HYDROcodone -acetaminophen  (NORCO/VICODIN) 5-325 MG tablet  3 times daily PRN        11/10/24 1336             Note:  This document was prepared using Dragon voice recognition software and may include unintentional dictation errors.    Loyd Candida LULLA Aldona, PA-C 11/10/24 2047  "

## 2024-11-10 NOTE — Discharge Instructions (Addendum)
 Your exam is consistent with a knee contusion and knee sprain.  Your sprain will likely heal without surgical intervention.  You should however consider follow-up with orthopedics for interim evaluation, and for any ongoing symptoms.  Rest with the leg elevated and apply ice to help reduce pain and swelling.  Wear the knee immobilizer for walking around.  Use the crutches to assist with your weightbearing as tolerated.  Take the prescription meds as directed.

## 2024-11-10 NOTE — ED Notes (Signed)
Patient discharged to home per MD order. Patient in stable condition, and deemed medically cleared by ED provider for discharge. Discharge instructions reviewed with patient/family using "Teach Back"; verbalized understanding of medication education and administration, and information about follow-up care. Denies further concerns. ° °

## 2024-12-16 ENCOUNTER — Ambulatory Visit: Admitting: Student
